# Patient Record
Sex: Male | Born: 1970 | Race: White | Hispanic: No | Marital: Single | Smoking: Current every day smoker
Health system: Southern US, Community
[De-identification: ages and names within clinical notes are randomized; demographics above are authoritative.]

## PROBLEM LIST (undated history)

## (undated) DIAGNOSIS — IMO0002 Reserved for concepts with insufficient information to code with codable children: Secondary | ICD-10-CM

## (undated) DIAGNOSIS — G459 Transient cerebral ischemic attack, unspecified: Secondary | ICD-10-CM

## (undated) HISTORY — PX: HERNIA REPAIR: SHX51

---

## 1999-06-21 ENCOUNTER — Emergency Department (HOSPITAL_COMMUNITY): Admission: EM | Admit: 1999-06-21 | Discharge: 1999-06-21 | Payer: Self-pay | Admitting: *Deleted

## 1999-08-30 ENCOUNTER — Emergency Department (HOSPITAL_COMMUNITY): Admission: EM | Admit: 1999-08-30 | Discharge: 1999-08-30 | Payer: Self-pay | Admitting: Emergency Medicine

## 2003-12-26 DIAGNOSIS — G459 Transient cerebral ischemic attack, unspecified: Secondary | ICD-10-CM

## 2003-12-26 HISTORY — DX: Transient cerebral ischemic attack, unspecified: G45.9

## 2004-09-12 ENCOUNTER — Emergency Department (HOSPITAL_COMMUNITY): Admission: EM | Admit: 2004-09-12 | Discharge: 2004-09-12 | Payer: Self-pay | Admitting: Family Medicine

## 2004-09-12 ENCOUNTER — Ambulatory Visit (HOSPITAL_COMMUNITY): Admission: RE | Admit: 2004-09-12 | Discharge: 2004-09-12 | Payer: Self-pay | Admitting: Family Medicine

## 2005-12-06 ENCOUNTER — Emergency Department (HOSPITAL_COMMUNITY): Admission: EM | Admit: 2005-12-06 | Discharge: 2005-12-06 | Payer: Self-pay | Admitting: Emergency Medicine

## 2005-12-27 ENCOUNTER — Ambulatory Visit (HOSPITAL_COMMUNITY): Admission: RE | Admit: 2005-12-27 | Discharge: 2005-12-27 | Payer: Self-pay | Admitting: Chiropractor
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
## 2009-02-03 ENCOUNTER — Emergency Department (HOSPITAL_COMMUNITY): Admission: EM | Admit: 2009-02-03 | Discharge: 2009-02-04 | Payer: Self-pay | Admitting: Emergency Medicine

## 2009-03-24 ENCOUNTER — Observation Stay (HOSPITAL_COMMUNITY): Admission: EM | Admit: 2009-03-24 | Discharge: 2009-03-25 | Payer: Self-pay | Admitting: Emergency Medicine

## 2009-11-25 ENCOUNTER — Emergency Department (HOSPITAL_COMMUNITY): Admission: EM | Admit: 2009-11-25 | Discharge: 2009-11-26 | Payer: Self-pay | Admitting: Emergency Medicine

## 2010-03-02 ENCOUNTER — Emergency Department (HOSPITAL_COMMUNITY): Admission: EM | Admit: 2010-03-02 | Discharge: 2010-03-02 | Payer: Self-pay | Admitting: Emergency Medicine

## 2010-07-11 ENCOUNTER — Emergency Department (HOSPITAL_COMMUNITY): Admission: EM | Admit: 2010-07-11 | Discharge: 2010-07-12 | Payer: Self-pay | Admitting: Emergency Medicine

## 2010-12-05 ENCOUNTER — Emergency Department (HOSPITAL_COMMUNITY)
Admission: EM | Admit: 2010-12-05 | Discharge: 2010-12-05 | Payer: Self-pay | Source: Home / Self Care | Admitting: Emergency Medicine

## 2011-03-11 LAB — DIFFERENTIAL
Basophils Absolute: 0 10*3/uL (ref 0.0–0.1)
Eosinophils Relative: 2 % (ref 0–5)
Lymphocytes Relative: 18 % (ref 12–46)
Monocytes Absolute: 0.7 10*3/uL (ref 0.1–1.0)
Monocytes Relative: 8 % (ref 3–12)

## 2011-03-11 LAB — POCT I-STAT, CHEM 8
BUN: 14 mg/dL (ref 6–23)
Creatinine, Ser: 0.9 mg/dL (ref 0.4–1.5)
Glucose, Bld: 94 mg/dL (ref 70–99)
Hemoglobin: 12.9 g/dL — ABNORMAL LOW (ref 13.0–17.0)
Potassium: 4.3 mEq/L (ref 3.5–5.1)

## 2011-03-11 LAB — URINALYSIS, ROUTINE W REFLEX MICROSCOPIC
Glucose, UA: NEGATIVE mg/dL
Specific Gravity, Urine: 1.026 (ref 1.005–1.030)

## 2011-03-11 LAB — CBC
HCT: 35.9 % — ABNORMAL LOW (ref 39.0–52.0)
Hemoglobin: 12.4 g/dL — ABNORMAL LOW (ref 13.0–17.0)
MCHC: 34.6 g/dL (ref 30.0–36.0)
MCV: 96.6 fL (ref 78.0–100.0)
RDW: 11.8 % (ref 11.5–15.5)
WBC: 7.8 10*3/uL (ref 4.0–10.5)

## 2011-03-11 LAB — URINE MICROSCOPIC-ADD ON

## 2011-03-11 LAB — URINE CULTURE

## 2011-04-05 LAB — GC/CHLAMYDIA PROBE AMP, URINE: Chlamydia, Swab/Urine, PCR: NEGATIVE

## 2011-04-06 LAB — CBC
HCT: 41.3 % (ref 39.0–52.0)
Hemoglobin: 14.5 g/dL (ref 13.0–17.0)
MCV: 95.4 fL (ref 78.0–100.0)
Platelets: 185 10*3/uL (ref 150–400)
RBC: 4.33 MIL/uL (ref 4.22–5.81)
WBC: 12.6 10*3/uL — ABNORMAL HIGH (ref 4.0–10.5)

## 2011-04-06 LAB — DIFFERENTIAL
Basophils Absolute: 0 10*3/uL (ref 0.0–0.1)
Basophils Relative: 0 % (ref 0–1)
Lymphocytes Relative: 9 % — ABNORMAL LOW (ref 12–46)
Monocytes Absolute: 0.8 10*3/uL (ref 0.1–1.0)
Neutro Abs: 10.4 10*3/uL — ABNORMAL HIGH (ref 1.7–7.7)

## 2011-04-06 LAB — COMPREHENSIVE METABOLIC PANEL
Alkaline Phosphatase: 74 U/L (ref 39–117)
BUN: 16 mg/dL (ref 6–23)
CO2: 27 mEq/L (ref 19–32)
Chloride: 105 mEq/L (ref 96–112)
Creatinine, Ser: 0.94 mg/dL (ref 0.4–1.5)
GFR calc non Af Amer: >60 mL/min (ref >60)
Glucose, Bld: 106 mg/dL — ABNORMAL HIGH (ref 70–99)
Potassium: 4.3 mEq/L (ref 3.5–5.1)
Total Bilirubin: 0.3 mg/dL (ref 0.3–1.2)

## 2011-04-06 LAB — URINALYSIS, ROUTINE W REFLEX MICROSCOPIC
Bilirubin Urine: NEGATIVE
Nitrite: NEGATIVE
Protein, ur: NEGATIVE mg/dL
Specific Gravity, Urine: 1.021 (ref 1.005–1.030)
Urobilinogen, UA: 1 mg/dL (ref 0.0–1.0)

## 2011-04-06 LAB — LIPASE, BLOOD: Lipase: 54 U/L (ref 11–59)

## 2011-04-06 LAB — URINE MICROSCOPIC-ADD ON

## 2011-04-11 LAB — CBC
MCV: 95.4 fL (ref 78.0–100.0)
Platelets: 215 10*3/uL (ref 150–400)
RBC: 4.55 MIL/uL (ref 4.22–5.81)
WBC: 8 10*3/uL (ref 4.0–10.5)

## 2011-04-11 LAB — COMPREHENSIVE METABOLIC PANEL
ALT: 39 U/L (ref 0–53)
AST: 26 U/L (ref 0–37)
Albumin: 3.8 g/dL (ref 3.5–5.2)
Alkaline Phosphatase: 71 U/L (ref 39–117)
CO2: 27 mEq/L (ref 19–32)
Chloride: 103 mEq/L (ref 96–112)
GFR calc Af Amer: >60 mL/min (ref >60)
GFR calc non Af Amer: >60 mL/min (ref >60)
Potassium: 3.8 mEq/L (ref 3.5–5.1)
Sodium: 137 mEq/L (ref 135–145)
Total Bilirubin: 0.7 mg/dL (ref 0.3–1.2)

## 2011-04-11 LAB — DIFFERENTIAL
Basophils Absolute: 0.1 10*3/uL (ref 0.0–0.1)
Eosinophils Absolute: 0.2 10*3/uL (ref 0.0–0.7)
Eosinophils Relative: 3 % (ref 0–5)
Lymphocytes Relative: 22 % (ref 12–46)
Monocytes Absolute: 0.5 10*3/uL (ref 0.1–1.0)

## 2011-04-11 LAB — URINALYSIS, ROUTINE W REFLEX MICROSCOPIC
Bilirubin Urine: NEGATIVE
Leukocytes, UA: NEGATIVE
Nitrite: NEGATIVE
Specific Gravity, Urine: 1.019 (ref 1.005–1.030)
Urobilinogen, UA: 0.2 mg/dL (ref 0.0–1.0)
pH: 6 (ref 5.0–8.0)

## 2011-04-11 LAB — LIPASE, BLOOD: Lipase: 49 U/L (ref 11–59)

## 2011-04-11 LAB — URINE MICROSCOPIC-ADD ON

## 2011-05-09 NOTE — H&P (Signed)
NAME:  Joe Mendez, Joe Mendez              ACCOUNT NO.:  [id number]   MEDICAL RECORD NO.:  [id number]          PATIENT TYPE:  OBV   LOCATION:  5012                         FACILITY:  [facility]   PHYSICIAN:  Della Goo, M.D. DATE OF BIRTH:  04-19-1971   DATE OF ADMISSION:  03/24/2009  DATE OF DISCHARGE:                              HISTORY & PHYSICAL   PRIMARY CARE PHYSICIAN:  Unassigned.   CHIEF COMPLAINT:  Right sided groin pain.   HISTORY OF PRESENT ILLNESS:  This is a 40 year old male who presented to  the emergency department secondary to complaints of severe worsening  pain in the right groin area.  He states that it has been worsening over  the past 2 weeks.  He reports that this condition has occurred off and  on for approximately 9 months.  He denies having any trauma to the area.  He denies having any dysuria or penile discharge.  He denies having any  fevers or chills.  He states that he had been seen in the hospital in  February 2010, for similar complaints and was evaluated.  However, he  reports that the workup was negative.  He states that he was placed on  pain medication to control his pain and he was released home and reports  that within 2 weeks the condition resolved, but returned again.  He  denies having any rash or night sweats.  He denies having any other  swollen glands that he has noticed.  The patient complains of having  right lower quadrant abdominal pain which has been crampy in nature as  well and was concerned that he had possible appendicitis.   PAST MEDICAL HISTORY:  None.   PAST SURGICAL HISTORY:  None.   MEDICATIONS:  None.   ALLERGIES:  PENICILLIN.   SOCIAL HISTORY:  The patient is a smoker and reports rare alcohol usage.   FAMILY HISTORY:  Positive for a very strong family history for cancers.   REVIEW OF SYSTEMS:  Pertinents are mentioned above.  All other organ  systems negative.   PHYSICAL EXAMINATION:  GENERAL:  This is a 40 year old  well-nourished,  well-developed male in discomfort, but no acute distress.  VITAL SIGNS:  Temperature 98.1, blood pressure 128/76, heart rate 77,  respirations 18.  O2 saturation 95-100%.  HEENT:  Normocephalic, atraumatic.  Pupils equally round and reactive to  light.  Extraocular movements are intact.  Funduscopic benign.  Oropharynx is clear.  NECK:  Supple.  Full range of motion.  No thyromegaly, adenopathy or  jugulovenous distention.  CARDIOVASCULAR:  Regular rate and rhythm.  No  murmurs, gallops or rubs.  LUNGS:  Clear to auscultation bilaterally.  ABDOMEN:  Positive bowel sounds.  Soft, nontender, nondistended.  EXTREMITIES:  Without cyanosis, clubbing or edema.  GENITOURINARY:  Examination of the genitourinary and inguinal areas  reveal unilateral adenopathy in the right inguinal area.  This area is  painful to palpation.  The lymph nodes in that area are enlarged and  firm.  Otherwise in the genitourinary area the patient has normal male  genitalia.  He is circumcised.  There  are no lesions seen or other  unusual masses.  There is no left-sided inguinal adenopathy present.  NEUROLOGIC:  The patient is alert and oriented x3.  There are no focal  deficits.   LABORATORY STUDIES:  White blood cell count 12.6, hemoglobin 14.5,  hematocrit 41.3, platelets 185, neutrophils 82%, lymphocytes 9%.  Sodium  139, potassium 4.3, chloride 105, carbon dioxide 27, BUN 16, creatinine  0.94, glucose 106, albumin 3.6, AST 19, ALT 23.  Urinalysis negative.  Urine microscopic; rare urine epithelials, 0-2 urine white blood cells,  3-6 urine red blood cells, rare bacteria.  Abdominal x-rays reveal a  normal bowel gas pattern with moderate fecal burden.   CT scan of the abdomen and pelvis reveal in the abdominal portion a  large hiatal hernia, but no acute abdominal abnormalities are seen.  A  low density lesion in the right inferior renal pole consistent with a  cyst is also seen and a CT of the  pelvis reveals no evidence of  appendicitis.  Enlargement of the prostate gland is also seen on the CT  of the pelvis.  Inflammatory changes in the right inguinal region with a  2 x 5 x 2.0 cm nodule with peripheral enhancement and central low  attenuation, possibly representing a phlegmon or small subcutaneous  abscess.  This area is suspicious for a small abscess or an infected  lymph node.  The left inguinal region was observed to be normal aside  from fatty infiltration of the inguinal canal.   ASSESSMENT:  A 40 year old male being admitted with;  1. Right lower quadrant abdominal and groin pain.  2. Right-sided inguinal adenopathy.  3. Abscess versus inflamed lymph node.  4. Leukocytosis with a left shift.   PLAN:  The patient will be admitted and placed on IV antibiotic therapy  of vancomycin.  IV fluids have been ordered and doxycycline has also  been ordered for oral administration.  Urine will be sent for gonorrhea  and chlamydia testing.  Pending these results, antibiotic therapy will  be further adjusted.  The patient will be placed on pain control therapy  and IV fluids have also been ordered for fluid resuscitation.  The  patient will be placed on DVT and GI prophylaxis as well and further  workup will ensue pending results of his studies and a biopsy of this  area will be considered.      Della Goo, M.D.  Electronically Signed     HJ/MEDQ  D:  03/24/2009  T:  03/24/2009  Job:  [id number]

## 2011-05-09 NOTE — Consult Note (Signed)
NAME:  LAWSEN, ARNOTT              ACCOUNT NO.:  [id number]   MEDICAL RECORD NO.:  [id number]          PATIENT TYPE:  OBV   LOCATION:  5012                         FACILITY:  [facility]   PHYSICIAN:  Lennie Muckle, MD      DATE OF BIRTH:  11/09/1971   DATE OF CONSULTATION:  03/24/2009  DATE OF DISCHARGE:                                 CONSULTATION   REQUESTING PHYSICIAN:  Eduard Clos, MD   REASON FOR CONSULTATION:  Lymphadenopathy versus right groin abscess.   HISTORY OF PRESENT ILLNESS:  Mr. Cecena is a 40 year old white male with  no significant past medical history but a rather complicated and unclear  history of this event.  Part of the history is provided by his sigficant  other who was present in the room as well as the patient. He states that  he has never had anything like this before that they need to concern but  then contradicts himself by stating this started in 2005.  Upon further  questioning, he states he is not able to give me a clear history and I  am not sure why; however, he says that his significant other is the only  one who knows his story.  Per his significant other, he has had pain for  the past 3-4 days.  He has never had any knot or lumps in his groin  before.  She states that he does have pain in this area.  He is not  having any urinary symptoms, penile discharge, or any other bowel  problems such as constipation or diarrhea.  Due to this pain, the  patient came to the emergency room where CT of the abdomen and pelvis  was done, which showed a 2.5 x 2.0 cm nodule with inflammatory changes  of the right groin, which is questionable for either a phlegmon or small  subcutaneous abscess.  He does report  having some involuntary leg  jerking, mostly in this right leg.  WBC is mildly elevated at 12,600.  We are consulted to provide opinion on whether this was a lymph node  versus an abscess.   REVIEW OF SYSTEMS:  Please see HPI, otherwise all other systems  are  negative.   PAST MEDICAL HISTORY:  None.   PAST SURGICAL HISTORY:  None.   SOCIAL HISTORY:  The patient has a significant other; however, he will  not definitively state whether this is his wife, girlfriend, fiancee,  etc.  His brother and his father are also present in this room.  He  works for [employer].  He does smoke approximately half a packet of  cigarettes a day, and he admits to rare alcohol use.   ALLERGIES:  PENICILLIN.   MEDICATIONS AT HOME:  None.   PHYSICAL EXAMINATION:  GENERAL:  This is a 40 year old white male who is  currently lying in bed in no acute distress.  VITAL SIGNS:  Temperature 98.3, pulse 78, respirations 18, and blood  pressure 115/66.  HEENT:  Head is normocephalic and atraumatic.  Sclerae noninjected.  Pupils were equal, round, and reactive to light.  Ears and nose without  any obvious masses or lesions.  No rhinorrhea.  Mouth is pink and moist.  Throat shows no exudate.  HEART:  Regular rate and rhythm.  Normal S1 and S2.  No murmurs,  gallops, or rubs are noted.  A +2 carotid, radial, and pedal pulses  bilaterally.  LUNGS:  Clear to auscultation bilaterally with no wheezes, rhonchi, or  rales noted.  Respiratory effort is nonlabored.  ABDOMEN:  Soft, nontender, and nondistended with active bowel sounds.  GENITOURINARY:  The patient has a right inguinal nodule that is very  tender to palpation, and it feels somewhat hard and not fluctuant.  He  does have some edema on the right side secondary to this nodule.  Otherwise, no other lymphadenopathy is noted on the right side and  shoddy lymph nodes are palapable on the left.  In this area, he does not  have any erythema or warmth.  Otherwise, his male genitalia is normal.  The patient does have a left inguinal hernia that is completely  reducible and pain free.  MUSCULOSKELETAL:  All 4 extremities are symmetrical with no cyanosis,  clubbing, or edema.  NEUROLOGIC:  Cranial nerves II through XII  appear to be grossly intact.  PSYCH:  The patient is alert and oriented x3.   LABORATORY DATA AND DIAGNOSTICS:  White blood cell count is 4600,  hemoglobin 14.5, hematocrit 41.3, platelets are 185,000, and neutrophils  are 82%.  Sodium 139, potassium 4.3, glucose 106, BUN 16, and creatinine  0.94.  Lipase is 54.  CT of the abdomen and pelvis per HPI.   IMPRESSION:  1. Right inguinal lymphadenopathy with an enlarged 2.5 x 2.0 cm lymph      node.  2. Asymptomatic reducible left inguinal hernia.  3. Minimal leukocytosis.   PLAN:  Upon reviewing the CT and the patient's history, more than likely  this is an enlarged lymph node.  Therefore, we would recommend  discontinuing the patient on vancomycin and just continuing him on  doxycycline.  We feel that the patient's lymph node will get better with  conservative treatment, and there is no surgical interrogation at this  time for removal of lymph node or biopsy.  As far as the patient's left  inguinal hernia, when disinfection and lymphadenopathy are completely  resolved, he may return to our office and see Dr. Freida Busman or any of our  physicians for an elective hernia repair.      Letha Cape, PA      Lennie Muckle, MD  Electronically Signed    KEO/MEDQ  D:  03/24/2009  T:  03/25/2009  Job:  [id number]   cc:   Eduard Clos, MD

## 2011-05-09 NOTE — Discharge Summary (Signed)
NAME:  Joe Mendez, Joe Mendez              ACCOUNT NO.:  [id number]   MEDICAL RECORD NO.:  [id number]          PATIENT TYPE:  OBV   LOCATION:  5012                         FACILITY:  [facility]   PHYSICIAN:  Eduard Clos, MDDATE OF BIRTH:  21-May-1971   DATE OF ADMISSION:  03/23/2009  DATE OF DISCHARGE:  03/25/2009                               DISCHARGE SUMMARY   COURSE IN THE HOSPITAL:  A 40 year old male with no significant past  medical history, presented with complaints of right groin pain.  The  patient had no associated dysuria, penile discharge, fever, or chills on  admission.  The patient had a CAT scan of abdomen and pelvis which  showed right inguinal region with 2.5 x 2 cm nodule with peripheral  enhancement and central low-attenuation.  The patient admitted to  medical floor and started on empiric antibiotics.  A surgical consult  was obtained.  As per Surgery, this was reactive lymphadenitis and there  was no abscess to be drained.  The patient did have right inguinal  hernia, which was reducible and per Surgery, no surgical intervention at  this time.  Once reactive lymphadenitis was treated with doxycycline, to  follow up with Surgery for further workup of his inguinal hernia at this  time.  The patient would be discharged home on doxycycline for 10 days  and followup with [facility] on Apr 27, 2009, and if the lymph nodes are  still persistently tender and enlarged then further workup which may  include a biopsy.  At the time of this dictation, the patient was  hemodynamically stable, and the patient's  pain has largely improved.   PROCEDURES DONE DURING THE STAY:  CT of the abdomen and pelvis.  Large  hiatal hernia.  No acute abdominal abnormality.  Low density lesion of  the right inferior renal pole  likely representing a cyst.  No evidence  of appendicitis or inflammatory changes in the right inguinal region  with 2.5 x 2 cm nodule with peripheral enhancement and central  low-  attenuation.  This probably represent either a phlegmon or a small  subcutaneous abscess.   FINAL DIAGNOSES:  1. Right inguinal region, reactive lymphadenitis.  2. Left inguinal hernia, reducible.   MEDICATIONS AT DISCHARGE:  1. Doxycycline 100 mg p.o. b.i.d. for 10 days.  2. The patient is to receive erythromycin 2 g p.o. 1 pill before      discharge.  3. Ibuprofen 800 mg p.o. t.i.d. p.r.n. with meals for pain.  If the      ibuprofen does not relieve the pain, then oxycodone acetaminophen      5/325 mg p.o. q.6 p.r.n. for pain.   PLAN:  The patient advised to follow up with [facility] in a month's  time on Apr 27, 2009, as scheduled.  The patient is strongly advised to  fill this appointment to further assess his lymphadenopathy.  To follow  up with [facility].  To call to make an appointment, [phone] for further workup on his left inguinal hernia.  The patient  advised to keep his appointments and to  be on a regular diet.     Eduard Clos, MD  Electronically Signed    ANK/MEDQ  D:  03/25/2009  T:  03/26/2009  Job:  [id number]

## 2011-09-23 ENCOUNTER — Emergency Department (HOSPITAL_COMMUNITY)
Admission: EM | Admit: 2011-09-23 | Discharge: 2011-09-23 | Disposition: A | Discharge status: Home / Self Care | Payer: Self-pay | Attending: Emergency Medicine | Admitting: Emergency Medicine

## 2011-09-23 DIAGNOSIS — K612 Anorectal abscess: Secondary | ICD-10-CM | POA: Insufficient documentation

## 2011-09-23 DIAGNOSIS — K6289 Other specified diseases of anus and rectum: Secondary | ICD-10-CM | POA: Insufficient documentation

## 2011-11-06 ENCOUNTER — Emergency Department (INDEPENDENT_AMBULATORY_CARE_PROVIDER_SITE_OTHER)
Admission: EM | Admit: 2011-11-06 | Discharge: 2011-11-06 | Disposition: A | Discharge status: Home / Self Care | Payer: Self-pay | Source: Home / Self Care | Attending: Family Medicine | Admitting: Family Medicine

## 2011-11-06 ENCOUNTER — Encounter (HOSPITAL_COMMUNITY): Payer: Self-pay | Admitting: Family Medicine

## 2011-11-06 DIAGNOSIS — H01009 Unspecified blepharitis unspecified eye, unspecified eyelid: Secondary | ICD-10-CM

## 2011-11-06 MED ORDER — GENTAMICIN SULFATE 0.3 % OP OINT: TOPICAL_OINTMENT | Freq: Three times a day (TID) | OPHTHALMIC | Status: AC

## 2011-11-06 NOTE — ED Notes (Signed)
Pt here with left eye redness and irritation that started Saturday.swelling seen above upper lid with sore to the touch.denies blurred vision today or dizziness.pt tried warm compresses

## 2011-11-06 NOTE — ED Provider Notes (Signed)
History     CSN: [id number] Arrival date & time: 11/06/2011  1:05 PM   First MD Initiated Contact with Patient 11/06/11 1255      Chief Complaint  Patient presents with  . Conjunctivitis    (Consider location/radiation/quality/duration/timing/severity/associated sxs/prior treatment) Patient is a 40 y.o. male presenting with conjunctivitis. The history is provided by the patient.  Conjunctivitis  The current episode started 2 days ago. The problem has been gradually improving. The problem is mild. Pertinent negatives include no fever, no decreased vision, no double vision, no eye itching, no photophobia, no eye discharge and no eye redness. The eye pain is mild.  He thinks some of the redness due to application of warm compress. Denies discharge or visual disturbance.   History reviewed. No pertinent past medical history.  No past surgical history on file.  No family history on file.  History  Substance Use Topics  . Smoking status: Not on file  . Smokeless tobacco: Not on file  . Alcohol Use: Not on file      Review of Systems  Constitutional: Negative.  Negative for fever.  HENT: Negative.   Eyes: Negative for double vision, photophobia, discharge, redness and itching.  Respiratory: Negative.   Cardiovascular: Negative.   Gastrointestinal: Negative.     Allergies  Penicillins  Home Medications   Current Outpatient Rx  Name Route Sig Dispense Refill  . GENTAMICIN SULFATE 0.3 % OP OINT Left Eye Place into the left eye 3 (three) times daily. 3.5 g 0    BP 127/92  Pulse 63  Temp(Src) 98.3 F (36.8 C) (Oral)  Resp 16  SpO2 99%  Physical Exam  Constitutional: He appears well-developed and well-nourished.  HENT:  Head: Normocephalic and atraumatic.  Eyes: Conjunctivae and EOM are normal. Pupils are equal, round, and reactive to light.       Swelling of the left upper eyelid. Small focus of erythema nasal border of the lid. No pustule.   Cardiovascular:  Normal rate.   Pulmonary/Chest: Effort normal.  Skin: Skin is warm and dry.    ED Course  Procedures (including critical care time)  Labs Reviewed - No data to display No results found.   1. Blepharitis       MDM          Randa Spike, MD 11/06/11 1407

## 2012-06-11 ENCOUNTER — Encounter (HOSPITAL_COMMUNITY): Payer: Self-pay | Admitting: Emergency Medicine

## 2012-06-11 DIAGNOSIS — R6884 Jaw pain: Secondary | ICD-10-CM | POA: Insufficient documentation

## 2012-06-11 DIAGNOSIS — S0003XA Contusion of scalp, initial encounter: Secondary | ICD-10-CM | POA: Insufficient documentation
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
## 2012-06-12 ENCOUNTER — Emergency Department (HOSPITAL_COMMUNITY): Payer: Self-pay

## 2012-06-12 ENCOUNTER — Emergency Department (HOSPITAL_COMMUNITY)
Admission: EM | Admit: 2012-06-12 | Discharge: 2012-06-12 | Disposition: A | Discharge status: Home / Self Care | Payer: Self-pay | Attending: Emergency Medicine | Admitting: Emergency Medicine

## 2012-06-12 DIAGNOSIS — S0083XA Contusion of other part of head, initial encounter: Secondary | ICD-10-CM

## 2012-06-12 MED ORDER — OXYCODONE-ACETAMINOPHEN 5-325 MG PO TABS
1.0000 | ORAL_TABLET | Freq: Once | ORAL | Status: AC
  Administered 2012-06-12: 1 via ORAL
  Filled 2012-06-12: qty 1

## 2012-06-12 NOTE — ED Notes (Signed)
Remains resting in bed on back. Denies needs. No distress. Call bell within reach. Pain 6\10. Family with patient.
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
## 2012-06-12 NOTE — ED Notes (Signed)
Pain 6/10 at this time. Resting comfortably. Call bell within reach. Denies needs. Family with patient.

## 2012-06-12 NOTE — ED Notes (Signed)
Medicated for 9/10 pain. Denies needs. Call bell within reach. No distress. Will continue to monitor.
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
## 2012-06-12 NOTE — ED Notes (Signed)
Resting with eyes open and lights on. Pain 9\10 at this time. Call bell within reach. Will continue to monitor. Awaiting MD eval.

## 2012-06-12 NOTE — ED Notes (Signed)
Patient back to room from radiology. Pain 5\10 at this time. No distress. Denies needs. Call bell within reach. Wife with patient.

## 2012-06-12 NOTE — ED Notes (Signed)
Remains resting in bed on back. No distress. Call bell within reach. Pain 9\10. Family with patient. Will continue to monitor.

## 2012-06-12 NOTE — ED Notes (Signed)
MD at bedside to evaluate.

## 2012-06-12 NOTE — ED Notes (Signed)
Patient to radiology via wheelchair.

## 2012-06-12 NOTE — ED Notes (Signed)
Remains resting on left side. No distress. Pain 6\10. Call bell within reach. Denies needs. Awaiting radiology.

## 2012-06-12 NOTE — Discharge Instructions (Signed)
Take, call or ibuprofen as needed for pain.  Contusion A contusion is a deep bruise. Contusions are the result of an injury that caused bleeding under the skin. The contusion may turn blue, purple, or yellow. Minor injuries will give you a painless contusion, but more severe contusions may stay painful and swollen for a few weeks.  CAUSES  A contusion is usually caused by a blow, trauma, or direct force to an area of the body. SYMPTOMS   Swelling and redness of the injured area.   Bruising of the injured area.   Tenderness and soreness of the injured area.   Pain.  DIAGNOSIS  The diagnosis can be made by taking a history and physical exam. An X-ray, CT scan, or MRI may be needed to determine if there were any associated injuries, such as fractures. TREATMENT  Specific treatment will depend on what area of the body was injured. In general, the best treatment for a contusion is resting, icing, elevating, and applying cold compresses to the injured area. Over-the-counter medicines may also be recommended for pain control. Ask your caregiver what the best treatment is for your contusion. HOME CARE INSTRUCTIONS   Put ice on the injured area.   Put ice in a plastic bag.   Place a towel between your skin and the bag.   Leave the ice on for 15 to 20 minutes, 3 to 4 times a day.   Only take over-the-counter or prescription medicines for pain, discomfort, or fever as directed by your caregiver. Your caregiver may recommend avoiding anti-inflammatory medicines (aspirin, ibuprofen, and naproxen) for 48 hours because these medicines may increase bruising.   Rest the injured area.   If possible, elevate the injured area to reduce swelling.  SEEK IMMEDIATE MEDICAL CARE IF:   You have increased bruising or swelling.   You have pain that is getting worse.   Your swelling or pain is not relieved with medicines.  MAKE SURE YOU:   Understand these instructions.   Will watch your condition.     Will get help right away if you are not doing well or get worse.  Document Released: 09/20/2005 Document Revised: 11/30/2011 Document Reviewed: 10/16/2011 [facility] Patient Information 2012 [place], [place].

## 2012-10-22 ENCOUNTER — Encounter (HOSPITAL_COMMUNITY): Payer: Self-pay | Admitting: *Deleted

## 2012-10-22 ENCOUNTER — Emergency Department (HOSPITAL_COMMUNITY)
Admission: EM | Admit: 2012-10-22 | Discharge: 2012-10-22 | Disposition: A | Discharge status: Home / Self Care | Payer: Self-pay | Attending: Emergency Medicine | Admitting: Emergency Medicine

## 2012-10-22 ENCOUNTER — Emergency Department (HOSPITAL_COMMUNITY): Payer: Self-pay

## 2012-10-22 DIAGNOSIS — F17200 Nicotine dependence, unspecified, uncomplicated: Secondary | ICD-10-CM | POA: Insufficient documentation

## 2012-10-22 DIAGNOSIS — IMO0002 Reserved for concepts with insufficient information to code with codable children: Secondary | ICD-10-CM | POA: Insufficient documentation

## 2012-10-22 DIAGNOSIS — Y939 Activity, unspecified: Secondary | ICD-10-CM | POA: Insufficient documentation

## 2012-10-22 DIAGNOSIS — Y929 Unspecified place or not applicable: Secondary | ICD-10-CM | POA: Insufficient documentation

## 2012-10-22 DIAGNOSIS — W11XXXA Fall on and from ladder, initial encounter: Secondary | ICD-10-CM | POA: Insufficient documentation

## 2012-10-22 DIAGNOSIS — S6980XA Other specified injuries of unspecified wrist, hand and finger(s), initial encounter: Secondary | ICD-10-CM | POA: Insufficient documentation

## 2012-10-22 DIAGNOSIS — S01409A Unspecified open wound of unspecified cheek and temporomandibular area, initial encounter: Secondary | ICD-10-CM | POA: Insufficient documentation

## 2012-10-22 DIAGNOSIS — S6990XA Unspecified injury of unspecified wrist, hand and finger(s), initial encounter: Secondary | ICD-10-CM | POA: Insufficient documentation

## 2012-10-22 MED ORDER — HYDROCODONE-ACETAMINOPHEN 5-325 MG PO TABS: 2.0000 | ORAL_TABLET | ORAL | Status: DC | PRN

## 2012-10-22 NOTE — ED Notes (Signed)
Patient transported to X-ray 

## 2012-10-22 NOTE — ED Notes (Signed)
Patient transported from X-ray 

## 2012-10-22 NOTE — ED Notes (Signed)
Pt fell off ladder yesterday when ladder slide  And now with right hand and concerned about ring find.  Pt has laceration to right cheek.  Pt denies LOC.  Pt states ladder was 7-8 feet high

## 2012-10-22 NOTE — ED Provider Notes (Signed)
History  Scribed for Nelia Shi, MD, the patient was seen in room TR07C/TR07C. This chart was scribed by Candelaria Stagers. The patient's care started at 6:52 PM   CSN: [id number]  Arrival date & time 10/22/12  4098   First MD Initiated Contact with Patient 10/22/12 1851      No chief complaint on file.    The history is provided by the patient. No language interpreter was used.   Joe Mendez is a 41 y.o. male who presents to the Emergency Department complaining of right ring finger pain after falling off a ladder yesterday injuring his finger.  He is also experiencing swelling of the right ring finger.  Pt denies LOC.  He states the fall was about 7-8 ft high.  He has no other injuries.   History reviewed. No pertinent past medical history.  Past Surgical History  Procedure Date  . Hernia repair     No family history on file.  History  Substance Use Topics  . Smoking status: Current Every Day Smoker -- 1.0 packs/day  . Smokeless tobacco: Not on file  . Alcohol Use: No      Review of Systems All other systems reviewed and are negative Allergies  Penicillins  Home Medications   Current Outpatient Rx  Name Route Sig Dispense Refill  . ACETAMINOPHEN 325 MG PO TABS Oral Take 650 mg by mouth daily as needed. For pain    . NEOSPORIN EX Apply externally Apply 1 application topically 2 (two) times daily as needed. For infection    . HYDROCODONE-ACETAMINOPHEN 5-325 MG PO TABS Oral Take 2 tablets by mouth every 4 (four) hours as needed for pain. 10 tablet 0    BP 127/71  Pulse 83  Temp 97.5 F (36.4 C) (Oral)  Resp 18  SpO2 99%  Physical Exam  Nursing note and vitals reviewed. Constitutional: He is oriented to person, place, and time. He appears well-developed and well-nourished. No distress.  HENT:  Head: Normocephalic and atraumatic.  Eyes: Pupils are equal, round, and reactive to light.  Neck: Normal range of motion.  Cardiovascular: Normal rate and  intact distal pulses.   Pulmonary/Chest: No respiratory distress.  Abdominal: Normal appearance. He exhibits no distension.  Musculoskeletal: Normal range of motion.       Right hand: He exhibits no deformity.       Hands: Neurological: He is alert and oriented to person, place, and time. No cranial nerve deficit.  Skin: Skin is warm and dry. No rash noted.  Psychiatric: He has a normal mood and affect. His behavior is normal.    ED Course  Procedures   DIAGNOSTIC STUDIES:  COORDINATION OF CARE:  18:54 Ordered: DG Finger Ring Right    Labs Reviewed - No data to display Dg Finger Ring Right  10/22/2012  *RADIOLOGY REPORT*  Clinical Data: Fall from ladder.  Left ring finger pain.  RIGHT RING FINGER 2+V  Comparison: None.  Findings: No acute bone or soft tissue abnormality is present.  IMPRESSION: Negative right ring finger.   Original Report Authenticated By: Jamesetta Orleans. MATTERN, M.D.      1. Finger injury       MDM  I personally performed the services described in this documentation, which was scribed in my presence. The recorded information has been reviewed and considered. I personally performed the services described in this documentation, which was scribed in my presence. The recorded information has been reviewed and considered.  Nelia Shi, MD 10/22/12 [phone]

## 2012-10-22 NOTE — ED Notes (Signed)
Multiple small abrasions noted to bilateral hands.  Swelling and decreased ROM noted to fourth digit of right hand.  Patient endorses pain to right hand.  Per patient report, patient fell from ladder yesterday striking his hands on the ground when falling.  Patient denies any other complaints at this time.

## 2013-07-21 ENCOUNTER — Emergency Department (HOSPITAL_COMMUNITY): Payer: Self-pay

## 2013-07-21 ENCOUNTER — Encounter (HOSPITAL_COMMUNITY): Payer: Self-pay | Admitting: *Deleted

## 2013-07-21 DIAGNOSIS — Z880 Allergy status to penicillin: Secondary | ICD-10-CM | POA: Insufficient documentation

## 2013-07-21 DIAGNOSIS — F17200 Nicotine dependence, unspecified, uncomplicated: Secondary | ICD-10-CM | POA: Insufficient documentation

## 2013-07-21 DIAGNOSIS — G8929 Other chronic pain: Secondary | ICD-10-CM | POA: Insufficient documentation

## 2013-07-21 DIAGNOSIS — M545 Low back pain, unspecified: Secondary | ICD-10-CM | POA: Insufficient documentation

## 2013-07-21 DIAGNOSIS — R0602 Shortness of breath: Secondary | ICD-10-CM | POA: Insufficient documentation

## 2013-07-21 DIAGNOSIS — R079 Chest pain, unspecified: Secondary | ICD-10-CM | POA: Insufficient documentation

## 2013-07-21 DIAGNOSIS — Z8673 Personal history of transient ischemic attack (TIA), and cerebral infarction without residual deficits: Secondary | ICD-10-CM | POA: Insufficient documentation

## 2013-07-21 DIAGNOSIS — R350 Frequency of micturition: Secondary | ICD-10-CM | POA: Insufficient documentation

## 2013-07-21 DIAGNOSIS — M6281 Muscle weakness (generalized): Secondary | ICD-10-CM | POA: Insufficient documentation

## 2013-07-21 DIAGNOSIS — R42 Dizziness and giddiness: Secondary | ICD-10-CM | POA: Insufficient documentation

## 2013-07-21 DIAGNOSIS — IMO0001 Reserved for inherently not codable concepts without codable children: Secondary | ICD-10-CM | POA: Insufficient documentation

## 2013-07-21 NOTE — ED Notes (Addendum)
Weakness dizziness blurry vision onset last night. Back pain, sob, CP onset this am. Back pain CP and sob all day today. No sx have resolved, all sx remain. Pt concerned for stroke. Alert, NAD, calm, interactive, resps e/u, speaking in clear complete sentences, skin W&D. Chest, Neck and back hurt worse with movement. PERRL, 3mm brisk, A&Ox4, MAEx4, no droop or drift, grip strength and coordination are equal and strong. Speech clear. LS CTA. Sx vague. "Pt is a delivery driver. Did not sleep well last night".

## 2013-07-22 ENCOUNTER — Emergency Department (HOSPITAL_COMMUNITY)
Admission: EM | Admit: 2013-07-22 | Discharge: 2013-07-22 | Disposition: A | Discharge status: Home / Self Care | Payer: Self-pay | Attending: Emergency Medicine | Admitting: Emergency Medicine

## 2013-07-22 ENCOUNTER — Emergency Department (HOSPITAL_COMMUNITY): Payer: Self-pay

## 2013-07-22 ENCOUNTER — Encounter (HOSPITAL_COMMUNITY): Payer: Self-pay

## 2013-07-22 DIAGNOSIS — G8929 Other chronic pain: Secondary | ICD-10-CM

## 2013-07-22 DIAGNOSIS — R5383 Other fatigue: Secondary | ICD-10-CM

## 2013-07-22 DIAGNOSIS — M549 Dorsalgia, unspecified: Secondary | ICD-10-CM

## 2013-07-22 DIAGNOSIS — R42 Dizziness and giddiness: Secondary | ICD-10-CM

## 2013-07-22 DIAGNOSIS — M791 Myalgia, unspecified site: Secondary | ICD-10-CM

## 2013-07-22 DIAGNOSIS — R5381 Other malaise: Secondary | ICD-10-CM

## 2013-07-22 HISTORY — DX: Transient cerebral ischemic attack, unspecified: G45.9

## 2013-07-22 LAB — CBC WITH DIFFERENTIAL/PLATELET
Basophils Absolute: 0 10*3/uL (ref 0.0–0.1)
Basophils Relative: 1 % (ref 0–1)
Eosinophils Absolute: 0.3 10*3/uL (ref 0.0–0.7)
Hemoglobin: 14.4 g/dL (ref 13.0–17.0)
MCH: 33.5 pg (ref 26.0–34.0)
MCHC: 36.1 g/dL — ABNORMAL HIGH (ref 30.0–36.0)
Monocytes Absolute: 0.5 10*3/uL (ref 0.1–1.0)
Monocytes Relative: 6 % (ref 3–12)
Neutro Abs: 6 10*3/uL (ref 1.7–7.7)
Neutrophils Relative %: 70 % (ref 43–77)
RDW: 12.4 % (ref 11.5–15.5)

## 2013-07-22 LAB — POCT I-STAT, CHEM 8
Calcium, Ion: 1.17 mmol/L (ref 1.12–1.23)
Glucose, Bld: 135 mg/dL — ABNORMAL HIGH (ref 70–99)
HCT: 42 % (ref 39.0–52.0)
Hemoglobin: 14.3 g/dL (ref 13.0–17.0)
Potassium: 3.1 mEq/L — ABNORMAL LOW (ref 3.5–5.1)

## 2013-07-22 LAB — URINALYSIS, ROUTINE W REFLEX MICROSCOPIC
Glucose, UA: NEGATIVE mg/dL
Hgb urine dipstick: NEGATIVE
Ketones, ur: NEGATIVE mg/dL
Protein, ur: NEGATIVE mg/dL
Urobilinogen, UA: 0.2 mg/dL (ref 0.0–1.0)

## 2013-07-22 LAB — CK TOTAL AND CKMB (NOT AT ARMC): Total CK: 86 U/L (ref 7–232)

## 2013-07-22 LAB — POCT I-STAT TROPONIN I: Troponin i, poc: 0.02 ng/mL (ref 0.00–0.08)

## 2013-07-22 MED ORDER — ONDANSETRON HCL 4 MG/2ML IJ SOLN
4.0000 mg | Freq: Once | INTRAMUSCULAR | Status: AC
  Administered 2013-07-22: 4 mg via INTRAVENOUS
  Filled 2013-07-22: qty 2

## 2013-07-22 MED ORDER — MORPHINE SULFATE 4 MG/ML IJ SOLN: 4.0000 mg | Freq: Once | INTRAMUSCULAR | Status: DC

## 2013-07-22 MED ORDER — TRAMADOL HCL 50 MG PO TABS: 50.0000 mg | ORAL_TABLET | Freq: Four times a day (QID) | ORAL | Status: AC | PRN

## 2013-07-22 MED ORDER — SODIUM CHLORIDE 0.9 % IV BOLUS (SEPSIS)
1000.0000 mL | Freq: Once | INTRAVENOUS | Status: AC
  Administered 2013-07-22: 1000 mL via INTRAVENOUS

## 2013-07-22 MED ORDER — MORPHINE SULFATE 4 MG/ML IJ SOLN
4.0000 mg | Freq: Once | INTRAMUSCULAR | Status: AC
  Administered 2013-07-22: 4 mg via INTRAVENOUS
  Filled 2013-07-22: qty 1

## 2013-07-22 MED ORDER — IOHEXOL 350 MG/ML SOLN
100.0000 mL | Freq: Once | INTRAVENOUS | Status: AC | PRN
  Administered 2013-07-22: 100 mL via INTRAVENOUS

## 2013-07-22 NOTE — Consult Note (Addendum)
[facility] Medical Consultation  Guiseppe Flanagan WUJ:[id number] DOB: [birth year] DOA: 07/22/2013 PCP: Default, Provider, MD   Requesting physician:EDP Date of consultation: 07/22/13 Reason for consultation: multiple symptoms  Impression/Recommendations 1. Multiple Symptoms -R sided chest pain, dizziness, weakness, back pain, headache and intermittent Shortness of breath -history very vague and constantly changing and workup in ER normal. -Vitals and labs normal -cardiac enzymes negative x2 -Ekg benign -chest pain: reproducible by palpating R lower rib cage -CT head normal, exam non focal -Xray L spine with old L1 compression fracture -could get CTA Chest and DC home if this is normal. -I have told pt and neighbor about DC home if Ct normal. -needs PCP  Zannie Cove, MD [phone]    Chief Complaint: multiple complaints  HPI: 41/M with alleged h/o TIA, presented to ER last pm with mulitple complaints, notable for back pain, R sided chest pain, weakness, dizziness yesterday morning. He is in the ER with some neighbors, pt frequently turns to his neighbors to ask them about his symptoms. His history changed multiple times during my interview. In addition he also has some SoB from time to time and head ache.    Review of Systems:  12 system review negative per HPI  Past Medical History  Diagnosis Date  . TIA (transient ischemic attack) 2005   Past Surgical History  Procedure Laterality Date  . Hernia repair     Social History:  reports that he has been smoking.  He does not have any smokeless tobacco history on file. He reports that he does not drink alcohol or use illicit drugs.  Allergies  Allergen Reactions  . Penicillins Itching and Nausea And Vomiting   Family History  Problem Relation Age of Onset  . Hypertension Mother     Prior to Admission medications   Medication Sig Start Date End Date Taking? Authorizing Provider  traMADol (ULTRAM) 50 MG tablet  Take 1 tablet (50 mg total) by mouth every 6 (six) hours as needed for pain. 07/22/13   Arman Filter, NP   Physical Exam: Blood pressure 113/72, pulse 76, temperature 98.4 F (36.9 C), temperature source Oral, resp. rate 26, SpO2 97.00%. Filed Vitals:   07/21/13 2325 07/22/13 0245 07/22/13 0300  BP: 134/80 115/68 113/72  Pulse: 78 82 76  Temp: 98.4 F (36.9 C)    TempSrc: Oral    Resp: 18 16 26   SpO2: 98% 98% 97%     General:  AAOx3 no distress  HEENTL: PERRLA, EOMI  Cardiovascular: S1S2/RRR  Resp: CTAB  Chest wall: tenderness in RLQ chest wall/rib cage to palpation  Abdomen: soft, Nt, BS present  Skin: no rahses  Musculoskeletal: no edema c/c  Psychiatric: appropriate mood and affect  Neurologic: no localising signs  Labs on Admission:  Basic Metabolic Panel:  Recent Labs Lab 07/22/13 0001  NA 140  K 3.1*  CL 102  GLUCOSE 135*  BUN 9  CREATININE 0.90   Liver Function Tests: No results found for this basename: AST, ALT, ALKPHOS, BILITOT, PROT, ALBUMIN,  in the last 168 hours No results found for this basename: LIPASE, AMYLASE,  in the last 168 hours No results found for this basename: AMMONIA,  in the last 168 hours CBC:  Recent Labs Lab 07/21/13 2345 07/22/13 0001  WBC 8.6  --   NEUTROABS 6.0  --   HGB 14.4 14.3  HCT 39.9 42.0  MCV 92.8  --   PLT 183  --    Cardiac Enzymes:  Recent  Labs Lab 07/22/13 0328  CKTOTAL 86  CKMB 1.5  TROPONINI <0.30   BNP: No components found with this basename: POCBNP,  CBG: No results found for this basename: GLUCAP,  in the last 168 hours  Radiological Exams on Admission: Dg Chest 2 View  07/21/2013   *RADIOLOGY REPORT*  Clinical Data: Back pain, weakness, shortness of breath and dizziness.  CHEST - 2 VIEW  Comparison: None.  Findings: Mild hyperinflation and interstitial prominence is suggestive of chronic lung disease.  No edema, infiltrate, nodule or pleural effusion is identified.  The heart size and  mediastinal contours are within normal limits.  Bony thorax is unremarkable.  IMPRESSION: No acute findings.  Probable component of chronic lung disease.   Original Report Authenticated By: Irish Lack, M.D.   Dg Lumbar Spine Complete  07/22/2013   *RADIOLOGY REPORT*  Clinical Data: Back pain.  Weakness.  No injury.  LUMBAR SPINE - COMPLETE 4+ VIEW  Comparison: 03/23/2009.  Findings: There are five lumbar type vertebral bodies.  Vertebral body height is preserved.  There is a chronic L1 compression fracture without 25% loss of anterior vertebral body height and minimal retropulsion.  This appears unchanged compared to CT 03/24/2009.  There is no acute osseous abnormality.  The intervertebral disc spaces are preserved.  No pars defects are present.  IMPRESSION: No acute osseous abnormality.  Chronic L1 compression fracture.   Original Report Authenticated By: Andreas Newport, M.D.   Ct Head Wo Contrast  07/22/2013   *RADIOLOGY REPORT*  Clinical Data: Weakness.  Blurry vision.  CT HEAD WITHOUT CONTRAST  Technique:  Contiguous axial images were obtained from the base of the skull through the vertex without contrast.  Comparison: 12/05/2010.  Findings: No mass lesion, mass effect, midline shift, hydrocephalus, hemorrhage.  No territorial ischemia or acute infarction. Paranasal sinuses appear within normal limits.  Left nasal septal spur.  Mastoid air cells clear.  Calvarium intact.  IMPRESSION: Negative CT head.   Original Report Authenticated By: Andreas Newport, M.D.    EKG: Independently reviewed. No acute St t wave changes  Time spent:  Keyera Hattabaugh [facility] Pager [phone]  If 7PM-7AM, please contact night-coverage [email or web address] Password [facility] 07/22/2013, 6:13 AM

## 2013-07-22 NOTE — ED Notes (Signed)
ED NP at bedside

## 2013-07-22 NOTE — ED Provider Notes (Signed)
Care assumed from Sharen Hones, NP. Patient was awaiting consult from hospitalists. Hospitalist Dr. Jomarie Longs saw patient and advise CTA of chest, if CTA is negative patient will be discharged home. If it is positive, call the hospitalist back. 7:55 AM CTA negative. Discussed results with patient. He is stable for discharge. Return precautions discussed. Patient states understanding of plan and is agreeable.  Trevor Mace, PA-C 07/22/13 [phone]

## 2013-07-22 NOTE — ED Notes (Signed)
Pt told test results by Dondra Spry NP and this RN. Pt and pt's family member requesting further information and requesting pt be admitted. Explained to pt and family that results were normal and Gail planned to discharge pt. Dondra Spry NP made aware of situation.

## 2013-07-22 NOTE — ED Provider Notes (Signed)
CSN: [id number]     Arrival date & time 07/21/13  2302 History     First MD Initiated Contact with Patient 07/22/13 0304     Chief Complaint  Patient presents with  . Back Pain  . Weakness  . Shortness of Breath  . Dizziness   (Consider location/radiation/quality/duration/timing/severity/associated sxs/prior Treatment) HPI Comments: Patient is very scattered in his history, but for the past 2, days.  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  He also states, that he's been having intermittent chest pain with arm heaviness for the past, month.  He has been dizzy.  He has been urinating frequently and on several occasions.  His urine has been very dark.  He, states his appetite is although he is been sleeping poorly as he just can't get comfortable to rest. Gives a history of having had a stroke in 2005 for unknown reasons.  He does not have known hypertension although he does have a family history of the same he does not take any medications on a regular basis.  He does smoke cigarettes.  Denies any alcohol use or recreational drugs  Patient is a 42 y.o. male presenting with back pain, weakness, and shortness of breath. The history is provided by the patient.  Back Pain Location:  Lumbar spine Quality:  Aching Radiates to:  Does not radiate Pain severity:  Moderate Onset quality:  Sudden Duration:  18 hours Timing:  Constant Chronicity:  New Relieved by:  None tried Worsened by:  Nothing tried Associated symptoms: weakness   Associated symptoms: no dysuria, no fever and no headaches   Weakness Associated symptoms include weakness. Pertinent negatives include no chills, congestion, fever, headaches, nausea, rash or vomiting.  Shortness of Breath Associated symptoms: no fever, no headaches, no rash and no vomiting     Past Medical History  Diagnosis Date  . TIA (transient ischemic attack) 2005    Past Surgical History  Procedure Laterality Date  . Hernia repair     Family History  Problem Relation Age of Onset  . Hypertension Mother    History  Substance Use Topics  . Smoking status: Current Every Day Smoker -- 1.00 packs/day  . Smokeless tobacco: Not on file  . Alcohol Use: No    Review of Systems  Constitutional: Negative for fever and chills.  HENT: Negative for congestion and rhinorrhea.   Respiratory: Positive for shortness of breath.   Cardiovascular: Negative for leg swelling.  Gastrointestinal: Negative for nausea and vomiting.  Genitourinary: Negative for dysuria and decreased urine volume.  Musculoskeletal: Positive for back pain.  Skin: Negative for rash and wound.  Neurological: Positive for dizziness and weakness. Negative for headaches.  All other systems reviewed and are negative.    Allergies  Penicillins  Home Medications  No current outpatient prescriptions on file. BP 113/72  Pulse 76  Temp(Src) 98.4 F (36.9 C) (Oral)  Resp 26  SpO2 97% Physical Exam  Nursing note and vitals reviewed. Constitutional: He is oriented to person, place, and time. He appears well-developed and well-nourished.  HENT:  Head: Normocephalic and atraumatic.  Eyes: Pupils are equal, round, and reactive to light.  Cardiovascular: Normal rate and regular rhythm.   Pulmonary/Chest: Effort normal and breath sounds normal.  Oxygen saturation on room air, 100%.  Patient speaking in full.    Abdominal: Soft.  Musculoskeletal: Normal range of motion. He exhibits no  edema and no tenderness.  Full range of motion, equal, strength, bilaterally, upper and lower extremities  Neurological: He is oriented to person, place, and time.  Skin: Skin is warm and dry.    ED Course   Procedures (including critical care time)  Labs Reviewed  CBC WITH DIFFERENTIAL - Abnormal; Notable for the following:    MCHC 36.1 (*)    All other components within normal limits  POCT  I-STAT, CHEM 8 - Abnormal; Notable for the following:    Potassium 3.1 (*)    Glucose, Bld 135 (*)    All other components within normal limits  URINALYSIS, ROUTINE W REFLEX MICROSCOPIC  CK TOTAL AND CKMB  TROPONIN I  POCT I-STAT TROPONIN I   Dg Chest 2 View  07/21/2013   *RADIOLOGY REPORT*  Clinical Data: Back pain, weakness, shortness of breath and dizziness.  CHEST - 2 VIEW  Comparison: None.  Findings: Mild hyperinflation and interstitial prominence is suggestive of chronic lung disease.  No edema, infiltrate, nodule or pleural effusion is identified.  The heart size and mediastinal contours are within normal limits.  Bony thorax is unremarkable.  IMPRESSION: No acute findings.  Probable component of chronic lung disease.   Original Report Authenticated By: Irish Lack, M.D.   No diagnosis found.  MDM  She has had 2 sets of negative cardiac markers, EKG, normal.  Head, x-ray, normal.  Chest x-ray labs patient has persistent migratory myalgias on each reexamination.  He has a new complaint is unsubstantiated by lab findings or x-ray findings.  I discussed with the hospitalist, who will examine the patient and will collaborate on  Discharge plan    Arman Filter, NP 07/22/13 [phone]

## 2013-07-22 NOTE — ED Notes (Signed)
Dr. Jomarie Longs at bedside. States to hold morphine administration at this time

## 2013-07-23 NOTE — ED Provider Notes (Signed)
Medical screening examination/treatment/procedure(s) were performed by non-physician practitioner and as supervising physician I was immediately available for consultation/collaboration.  Taiana Temkin, MD 07/23/13 0439 

## 2013-07-23 NOTE — ED Provider Notes (Signed)
Medical screening examination/treatment/procedure(s) were performed by non-physician practitioner and as supervising physician I was immediately available for consultation/collaboration.  Sunnie Nielsen, MD 07/23/13 [phone]

## 2013-08-15 ENCOUNTER — Emergency Department (HOSPITAL_COMMUNITY)
Admission: EM | Admit: 2013-08-15 | Discharge: 2013-08-16 | Disposition: A | Discharge status: Home / Self Care | Payer: Self-pay | Attending: Emergency Medicine | Admitting: Emergency Medicine

## 2013-08-15 ENCOUNTER — Emergency Department (HOSPITAL_COMMUNITY): Payer: Self-pay

## 2013-08-15 ENCOUNTER — Encounter (HOSPITAL_COMMUNITY): Payer: Self-pay | Admitting: Emergency Medicine

## 2013-08-15 DIAGNOSIS — R05 Cough: Secondary | ICD-10-CM | POA: Insufficient documentation

## 2013-08-15 DIAGNOSIS — R059 Cough, unspecified: Secondary | ICD-10-CM | POA: Insufficient documentation

## 2013-08-15 DIAGNOSIS — F17200 Nicotine dependence, unspecified, uncomplicated: Secondary | ICD-10-CM | POA: Insufficient documentation

## 2013-08-15 DIAGNOSIS — Z720 Tobacco use: Secondary | ICD-10-CM

## 2013-08-15 DIAGNOSIS — Z8673 Personal history of transient ischemic attack (TIA), and cerebral infarction without residual deficits: Secondary | ICD-10-CM | POA: Insufficient documentation

## 2013-08-15 DIAGNOSIS — J440 Chronic obstructive pulmonary disease with acute lower respiratory infection: Secondary | ICD-10-CM | POA: Insufficient documentation

## 2013-08-15 DIAGNOSIS — Z880 Allergy status to penicillin: Secondary | ICD-10-CM | POA: Insufficient documentation

## 2013-08-15 DIAGNOSIS — J209 Acute bronchitis, unspecified: Secondary | ICD-10-CM | POA: Insufficient documentation

## 2013-08-15 DIAGNOSIS — Z79899 Other long term (current) drug therapy: Secondary | ICD-10-CM | POA: Insufficient documentation

## 2013-08-15 LAB — BASIC METABOLIC PANEL
BUN: 19 mg/dL (ref 6–23)
Creatinine, Ser: 0.8 mg/dL (ref 0.50–1.35)
GFR calc Af Amer: >90 mL/min (ref >90)
GFR calc non Af Amer: >90 mL/min (ref >90)

## 2013-08-15 LAB — CBC
HCT: 39.8 % (ref 39.0–52.0)
MCH: 33.2 pg (ref 26.0–34.0)
MCHC: 35.7 g/dL (ref 30.0–36.0)
MCV: 93 fL (ref 78.0–100.0)
RDW: 12.6 % (ref 11.5–15.5)

## 2013-08-15 NOTE — ED Notes (Signed)
Patient with shortness of breath and sore throat with scratchiness and loosing his voice.  Patient states that it has been going on for two weeks.

## 2013-08-16 MED ORDER — ALBUTEROL SULFATE HFA 108 (90 BASE) MCG/ACT IN AERS
1.0000 | INHALATION_SPRAY | RESPIRATORY_TRACT | Status: DC | PRN
  Administered 2013-08-16: 2 via RESPIRATORY_TRACT
  Filled 2013-08-16: qty 6.7

## 2013-08-16 MED ORDER — BENZONATATE 100 MG PO CAPS
200.0000 mg | ORAL_CAPSULE | Freq: Three times a day (TID) | ORAL | Status: DC | PRN
  Administered 2013-08-16: 200 mg via ORAL
  Filled 2013-08-16: qty 2

## 2013-08-16 MED ORDER — BENZONATATE 200 MG PO CAPS: 200.0000 mg | ORAL_CAPSULE | Freq: Three times a day (TID) | ORAL | Status: AC | PRN

## 2013-08-16 MED ORDER — ALBUTEROL SULFATE HFA 108 (90 BASE) MCG/ACT IN AERS: 1.0000 | INHALATION_SPRAY | RESPIRATORY_TRACT | Status: AC | PRN

## 2013-08-16 MED ORDER — HYDROCOD POLST-CHLORPHEN POLST 10-8 MG/5ML PO LQCR: 5.0000 mL | Freq: Two times a day (BID) | ORAL | Status: AC | PRN

## 2013-08-16 NOTE — ED Provider Notes (Signed)
CSN: [id number]     Arrival date & time 08/15/13  2204 History     First MD Initiated Contact with Patient 08/15/13 2347     Chief Complaint  Patient presents with  . Shortness of Breath   (Consider location/radiation/quality/duration/timing/severity/associated sxs/prior Treatment) HPI 42 yo male presents to the ER with complaint of cough, sob, scratchy voice for 2-3 weeks.  He has tried otc cough medication without improvement in symptoms.  Pt is 1ppd smoker.  He thinks he may have had some fevers about 2 weeks ago.  Pt reports sxs are worse with talking.  Pt seen in the ER on 7/29 with multiple complaints and negative workup.   Past Medical History  Diagnosis Date  . TIA (transient ischemic attack) 2005   Past Surgical History  Procedure Laterality Date  . Hernia repair     Family History  Problem Relation Age of Onset  . Hypertension Mother    History  Substance Use Topics  . Smoking status: Current Every Day Smoker -- 1.00 packs/day  . Smokeless tobacco: Not on file  . Alcohol Use: No    Review of Systems  All other systems reviewed and are negative.    Allergies  Penicillins  Home Medications   Current Outpatient Rx  Name  Route  Sig  Dispense  Refill  . traMADol (ULTRAM) 50 MG tablet   Oral   Take 1 tablet (50 mg total) by mouth every 6 (six) hours as needed for pain.   15 tablet   0   . albuterol (PROVENTIL HFA;VENTOLIN HFA) 108 (90 BASE) MCG/ACT inhaler   Inhalation   Inhale 1-2 puffs into the lungs every 4 (four) hours as needed for wheezing or shortness of breath.   1 Inhaler   1   . benzonatate (TESSALON) 200 MG capsule   Oral   Take 1 capsule (200 mg total) by mouth 3 (three) times daily as needed for cough.   20 capsule   0   . chlorpheniramine-HYDROcodone (TUSSIONEX PENNKINETIC ER) 10-8 MG/5ML LQCR   Oral   Take 5 mLs by mouth every 12 (twelve) hours as needed (for cough).   140 mL   0    BP 115/81  Pulse 89  Temp(Src) 97.5 F  (36.4 C) (Oral)  Resp 16  SpO2 97% Physical Exam  Nursing note and vitals reviewed. Constitutional: He is oriented to person, place, and time. He appears well-developed and well-nourished.  HENT:  Head: Normocephalic and atraumatic.  Right Ear: External ear normal.  Left Ear: External ear normal.  Nose: Nose normal.  Mouth/Throat: Oropharynx is clear and moist.  Hoarse voice  Eyes: Conjunctivae and EOM are normal. Pupils are equal, round, and reactive to light.  Neck: Normal range of motion. Neck supple. No JVD present. No tracheal deviation present. No thyromegaly present.  Cardiovascular: Normal rate, regular rhythm, normal heart sounds and intact distal pulses.  Exam reveals no gallop and no friction rub.   No murmur heard. Pulmonary/Chest: Effort normal. No stridor. No respiratory distress. He has wheezes. He has no rales. He exhibits no tenderness.  Abdominal: Soft. Bowel sounds are normal. He exhibits no distension and no mass. There is no tenderness. There is no rebound and no guarding.  Musculoskeletal: Normal range of motion. He exhibits no edema and no tenderness.  Lymphadenopathy:    He has no cervical adenopathy.  Neurological: He is alert and oriented to person, place, and time. He exhibits normal muscle tone. Coordination normal.  Skin: Skin is warm and dry. No rash noted. No erythema. No pallor.  Psychiatric: He has a normal mood and affect. His behavior is normal. Judgment and thought content normal.    ED Course   Procedures (including critical care time)  Labs Reviewed  BASIC METABOLIC PANEL - Abnormal; Notable for the following:    Glucose, Bld 112 (*)    All other components within normal limits  CBC   Dg Chest 2 View  08/15/2013   *RADIOLOGY REPORT*  Clinical Data: Shortness of breath.  Cough.  Loss of breath and voice.  Smoker.  CHEST - 2 VIEW  Comparison: 07/21/2013  Findings: The normal heart size and pulmonary vascularity.  Central interstitial changes  with peribronchial thickening consistent with chronic bronchitis.  No focal consolidation or airspace disease in the lungs.  No blunting of costophrenic angles.  No pneumothorax. Mediastinal contours appear intact.  No significant change since previous study.  IMPRESSION: Bronchitic changes in the lungs.  No evidence of active pulmonary disease.   Original Report Authenticated By: Burman Nieves, M.D.    Date: 08/16/2013  Rate: 86  Rhythm: normal sinus rhythm  QRS Axis: normal  Intervals: normal  ST/T Wave abnormalities: normal  Conduction Disutrbances:none  Narrative Interpretation:   Old EKG Reviewed: unchanged    1. Bronchitis, chronic obstructive w acute bronchitis   2. Tobacco abuse     MDM  42 yo male with ongoing cough, hoarse voice, bronchitic changes on cxr.  Will treat with tessalon, tussinex, and albuterol.  Pt counseled on smoking cessation.  Olivia Mackie, MD 08/16/13 [phone]

## 2014-02-02 ENCOUNTER — Emergency Department (HOSPITAL_COMMUNITY)
Admission: EM | Admit: 2014-02-02 | Discharge: 2014-02-02 | Disposition: A | Discharge status: Home / Self Care | Payer: Self-pay | Attending: Emergency Medicine | Admitting: Emergency Medicine

## 2014-02-02 ENCOUNTER — Encounter (HOSPITAL_COMMUNITY): Payer: Self-pay | Admitting: Emergency Medicine

## 2014-02-02 DIAGNOSIS — Z8739 Personal history of other diseases of the musculoskeletal system and connective tissue: Secondary | ICD-10-CM | POA: Insufficient documentation

## 2014-02-02 DIAGNOSIS — Z880 Allergy status to penicillin: Secondary | ICD-10-CM | POA: Insufficient documentation

## 2014-02-02 DIAGNOSIS — Y9389 Activity, other specified: Secondary | ICD-10-CM | POA: Insufficient documentation

## 2014-02-02 DIAGNOSIS — T1590XA Foreign body on external eye, part unspecified, unspecified eye, initial encounter: Secondary | ICD-10-CM | POA: Insufficient documentation

## 2014-02-02 DIAGNOSIS — Z8673 Personal history of transient ischemic attack (TIA), and cerebral infarction without residual deficits: Secondary | ICD-10-CM | POA: Insufficient documentation

## 2014-02-02 DIAGNOSIS — R51 Headache: Secondary | ICD-10-CM | POA: Insufficient documentation

## 2014-02-02 DIAGNOSIS — Y990 Civilian activity done for income or pay: Secondary | ICD-10-CM | POA: Insufficient documentation

## 2014-02-02 DIAGNOSIS — Z79899 Other long term (current) drug therapy: Secondary | ICD-10-CM | POA: Insufficient documentation

## 2014-02-02 DIAGNOSIS — F17200 Nicotine dependence, unspecified, uncomplicated: Secondary | ICD-10-CM | POA: Insufficient documentation

## 2014-02-02 DIAGNOSIS — Y9289 Other specified places as the place of occurrence of the external cause: Secondary | ICD-10-CM | POA: Insufficient documentation

## 2014-02-02 DIAGNOSIS — T1500XA Foreign body in cornea, unspecified eye, initial encounter: Secondary | ICD-10-CM | POA: Insufficient documentation

## 2014-02-02 DIAGNOSIS — J3489 Other specified disorders of nose and nasal sinuses: Secondary | ICD-10-CM | POA: Insufficient documentation

## 2014-02-02 HISTORY — DX: Reserved for concepts with insufficient information to code with codable children: IMO0002

## 2014-02-02 MED ORDER — TETRACAINE HCL 0.5 % OP SOLN
OPHTHALMIC | Status: AC
  Administered 2014-02-02: 21:00:00
  Filled 2014-02-02: qty 2

## 2014-02-02 MED ORDER — ERYTHROMYCIN 5 MG/GM OP OINT
TOPICAL_OINTMENT | Freq: Once | OPHTHALMIC | Status: AC
  Administered 2014-02-02: 21:00:00 via OPHTHALMIC
  Filled 2014-02-02: qty 3.5

## 2014-02-02 MED ORDER — KETOROLAC TROMETHAMINE 0.5 % OP SOLN
1.0000 [drp] | Freq: Four times a day (QID) | OPHTHALMIC | Status: DC
  Administered 2014-02-02: 1 [drp] via OPHTHALMIC
  Filled 2014-02-02: qty 5

## 2014-02-02 NOTE — Discharge Instructions (Signed)
Eye, Foreign Body The term foreign body refers to any object near, on the surface of or in the eye that should not be there. A foreign body may be a small speck of dirt or dust, a hair or eyelash, a splinter or any object. CAUSES  Foreign bodies can get in the eye by:  Flying pieces of something that was broken or destroyed (debris).  A sudden injury (trauma) to the eye. SYMPTOMS  Symptoms depend on what the foreign body is and where it is in the eye. The most common locations are:  On the inner surface of the upper or lower eyelids or on the covering of the white part of the eye (conjunctiva). Symptoms in this location are:  Irritating and painful, especially when blinking.  Feeling like something is in the eye.  On the surface of the clear covering on the front of the eye (cornea). A corneal foreign body has symptoms that:  Are painful and irritating since the cornea is very sensitive.  Form small "rust rings" around a metallic foreign body. Metallic foreign bodies stick more firmly to the surface of the cornea.  Inside the eyeball. Infection can happen fast and can be hard to treat with antibiotics. This is an extremely dangerous situation. Foreign bodies inside the eye can threaten vision. A person may even loose their eye. Foreign bodies inside the eye may cause:  Great pain.  Immediate loss of vision. DIAGNOSIS  Foreign bodies are found during an exam by an eye specialist. Those that are on the eyelids, conjunctiva or cornea are usually (but not always) easily found. When a foreign body is inside the eyeball, a cataract may form almost right away. This makes it hard for an ophthalmologist to find the foreign body. Special tests may be needed, including ultrasound testing, X-rays and CT scans. TREATMENT   Foreign bodies that are on the eyelids, conjunctiva or cornea are often removed easily and painlessly.  If the foreign body has caused a scratch or abrasion of the cornea,  antibiotic drops, ointments and/or a tight patch called a "pressure patch" may be needed. Follow-up exams will be needed for several days until the abrasion heals.  Surgery is needed right away if the foreign body is inside the eyeball. This is a medical emergency. An antibiotic therapy will likely be given to stop an infection. HOME CARE INSTRUCTIONS  The use of eye patches is not universal. Their use varies from state to state and from caregiver to caregiver. If an eye patch was applied:  Keep the eye patch on for as long as directed by your caregiver until the follow-up appointment.  Do not remove the patch to put in medications unless instructed to do so. When replacing the patch, retape it as it was before. Follow the same procedure if the patch becomes loose.  WARNING: Do not drive or operate machinery while the eye is patched. The ability to judge distances will be impaired.  Only take over-the-counter or prescription medicines for pain, discomfort or fever as directed by the caregiver. If no eye patch was applied:  Keep the eye closed as much as possible. Do not rub the eye.  Wear dark glasses as needed to protect the eyes from bright light.  Do not wear contact lenses until the eye feels normal again, or as instructed.  Wear protective eye covering if there is a risk of eye injury. This is important when working with high speed tools.  Only take over-the-counter or   prescription medicines for pain, discomfort or fever as directed by the caregiver. SEEK IMMEDIATE MEDICAL CARE IF:   Pain increases in the eye or the vision changes.  You or your child has problems with the eye patch.  The injury to the eye appears to be getting larger.  There is discharge from the injured eye.  Swelling and/or soreness (inflammation) develops around the affected eye.  You or your child has an oral temperature above 102 F (38.9 C), not controlled by medicine.  Your baby is older than 3  months with a rectal temperature of 102 F (38.9 C) or higher.  Your baby is 403 months old or younger with a rectal temperature of 100.4 F (38 C) or higher. MAKE SURE YOU:   Understand these instructions.  Will watch your condition.  Will get help right away if you are not doing well or get worse. Document Released: 12/11/2005 Document Revised: 03/04/2012 Document Reviewed: 05/08/2013 [facility] Patient Information 2014 [place], [place].   You may apply one drop of the ketorolac eye drop every 4 hours to your left eye - this is an anti - inflammatory pain  Reliever.  Apply one small ribbon of the antibiotic ointment twice daily.  See Dr. Lita MainsHaines tomorrow.

## 2014-02-02 NOTE — ED Notes (Addendum)
Per patient c/o foreign object in left eye. Per patient was working under house yesterday. Unsure of what is in eye but states "I can feel it grinding on my eye." Patient's sclera red. Patient reports clear drainage form eye.

## 2014-02-03 NOTE — ED Provider Notes (Signed)
CSN: [id number]     Arrival date & time 02/02/14  1732 History   First MD Initiated Contact with Patient 02/02/14 2026     Chief Complaint  Patient presents with  . Foreign Body in Eye     (Consider location/radiation/quality/duration/timing/severity/associated sxs/prior Treatment) Patient is a 43 y.o. male presenting with foreign body in eye. The history is provided by the patient.  Foreign Body in Eye This is a new problem. 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 The problem occurs constantly. The problem has been unchanged. Associated symptoms include headaches and a visual change. Pertinent negatives include no arthralgias, chest pain, congestion, fever, joint swelling, nausea, neck pain, numbness, rash, sore throat or weakness. Associated symptoms comments: Vision in the left eye is blurry due to copious tears and reports is also light sensitive.. Exacerbated by: blinking. Treatments tried: visine. The treatment provided no relief.    Past Medical History  Diagnosis Date  . TIA (transient ischemic attack) 2005  . Disc degeneration    Past Surgical History  Procedure Laterality Date  . Hernia repair     Family History  Problem Relation Age of Onset  . Hypertension Mother    History  Substance Use Topics  . Smoking status: Current Every Day Smoker -- 1.00 packs/day for 4 years    Types: Cigarettes  . Smokeless tobacco: Never Used  . Alcohol Use: No    Review of Systems  Constitutional: Negative for fever.  HENT: Positive for rhinorrhea. Negative for congestion, ear pain, facial swelling and sore throat.   Eyes: Positive for photophobia, pain, redness and visual disturbance.  Respiratory: Negative for shortness of breath.   Cardiovascular: Negative for chest pain.  Gastrointestinal: Negative for nausea.  Genitourinary: Negative.   Musculoskeletal: Negative for arthralgias,  joint swelling and neck pain.  Skin: Negative.  Negative for rash and wound.  Neurological: Positive for headaches. Negative for dizziness, weakness, light-headedness and numbness.  Psychiatric/Behavioral: Negative.       Allergies  Penicillins  Home Medications   Current Outpatient Rx  Name  Route  Sig  Dispense  Refill  . albuterol (PROVENTIL HFA;VENTOLIN HFA) 108 (90 BASE) MCG/ACT inhaler   Inhalation   Inhale 1-2 puffs into the lungs every 4 (four) hours as needed for wheezing or shortness of breath.   1 Inhaler   1   . benzonatate (TESSALON) 200 MG capsule   Oral   Take 1 capsule (200 mg total) by mouth 3 (three) times daily as needed for cough.   20 capsule   0   . chlorpheniramine-HYDROcodone (TUSSIONEX PENNKINETIC ER) 10-8 MG/5ML LQCR   Oral   Take 5 mLs by mouth every 12 (twelve) hours as needed (for cough).   140 mL   0   . traMADol (ULTRAM) 50 MG tablet   Oral   Take 1 tablet (50 mg total) by mouth every 6 (six) hours as needed for pain.   15 tablet   0    BP 113/71  Pulse 85  Temp(Src) 98.4 F (36.9 C) (Oral)  Resp 20  Ht 5\' 9"  (1.753 m)  Wt 194 lb (87.998 kg)  BMI 28.64 kg/m2  SpO2 98% Physical Exam  Nursing note and vitals reviewed. Constitutional: He appears well-developed and well-nourished.  HENT:  Head: Normocephalic and atraumatic.  Eyes: Pupils are equal, round, and reactive to light. Left eye exhibits no chemosis, no discharge  and no exudate. Foreign body present in the left eye. Left conjunctiva is injected. Left eye exhibits normal extraocular motion.  Slit lamp exam:      The left eye shows foreign body and fluorescein uptake.  Neck: Normal range of motion.  Cardiovascular: Normal rate.   Musculoskeletal: Normal range of motion.  Neurological: He is alert.  Skin: Skin is warm and dry.  Psychiatric: He has a normal mood and affect.   Visual acuity not obtained during this visit.  ED Course  FOREIGN BODY REMOVAL Date/Time:  02/02/2014 8:45 PM Performed by: Burgess Amor Authorized by: Burgess Amor Consent: Verbal consent obtained. Risks and benefits: risks, benefits and alternatives were discussed Consent given by: patient Patient identity confirmed: verbally with patient Time out: Immediately prior to procedure a "time out" was called to verify the correct patient, procedure, equipment, support staff and site/side marked as required. Body area: eye Location details: left cornea Local anesthetic: tetracaine drops Anesthetic total: 4 drops Patient sedated: no Removal mechanism: moist cotton swab (cotton swab removed small fb easily,  deeper more embedded fb beneath the first fb not amenable to remvoval using insulin syringe) Eye examined with fluorescein. Fluorescein uptake. Corneal abrasion size: small Corneal abrasion location: medial No residual rust ring present. Dressing: eye patch and antibiotic ointment Depth: superficial 1 objects recovered. Objects recovered: debris,  possible tiny wood splinter Post-procedure assessment: residual foreign bodies remain Patient tolerance: Patient tolerated the procedure well with no immediate complications.   (including critical care time) Labs Review Labs Reviewed - No data to display Imaging Review No results found.  EKG Interpretation   None       MDM   Final diagnoses:  Eye foreign body    Discussed patient with Dr. Estell Harpin who recommended eye patch, abx ointment, f/u with ophthalmology in am.  Pt referred to Dr Lita Mains - he will call in am for appt .  Eye was patched after apply a drop of ketolac drops for pain relief, erythromycin ointment ribbon also placed by RN.  The patient appears reasonably screened and/or stabilized for discharge and I doubt any other medical condition or other [facility] requiring further screening, evaluation, or treatment in the ED at this time prior to discharge.      Burgess Amor, PA-C 02/03/14 1514

## 2014-02-05 NOTE — ED Provider Notes (Signed)
Medical screening examination/treatment/procedure(s) were performed by non-physician practitioner and as supervising physician I was immediately available for consultation/collaboration.  EKG Interpretation   None         Crystalmarie Yasin L Kasey Ewings, MD 02/05/14 1434 

## 2014-02-26 ENCOUNTER — Ambulatory Visit (HOSPITAL_COMMUNITY)
Admission: RE | Admit: 2014-02-26 | Discharge: 2014-02-26 | Disposition: A | Discharge status: Home / Self Care | Payer: Disability Insurance | Source: Ambulatory Visit | Attending: Family Medicine | Admitting: Family Medicine

## 2014-02-26 ENCOUNTER — Other Ambulatory Visit (HOSPITAL_COMMUNITY): Payer: Self-pay | Admitting: Family Medicine

## 2014-02-26 DIAGNOSIS — M545 Low back pain, unspecified: Secondary | ICD-10-CM

## 2014-11-03 ENCOUNTER — Telehealth: Payer: Self-pay | Admitting: *Deleted

## 2014-11-04 NOTE — Telephone Encounter (Signed)
error 

## 2015-01-18 IMAGING — CR DG CHEST 2V
2 series · 2 of 2 positions shown · non-contrast
Comparison: None.

CLINICAL DATA: Back pain, weakness, shortness of breath and
dizziness.

CHEST - 2 VIEW

[w chest pa]
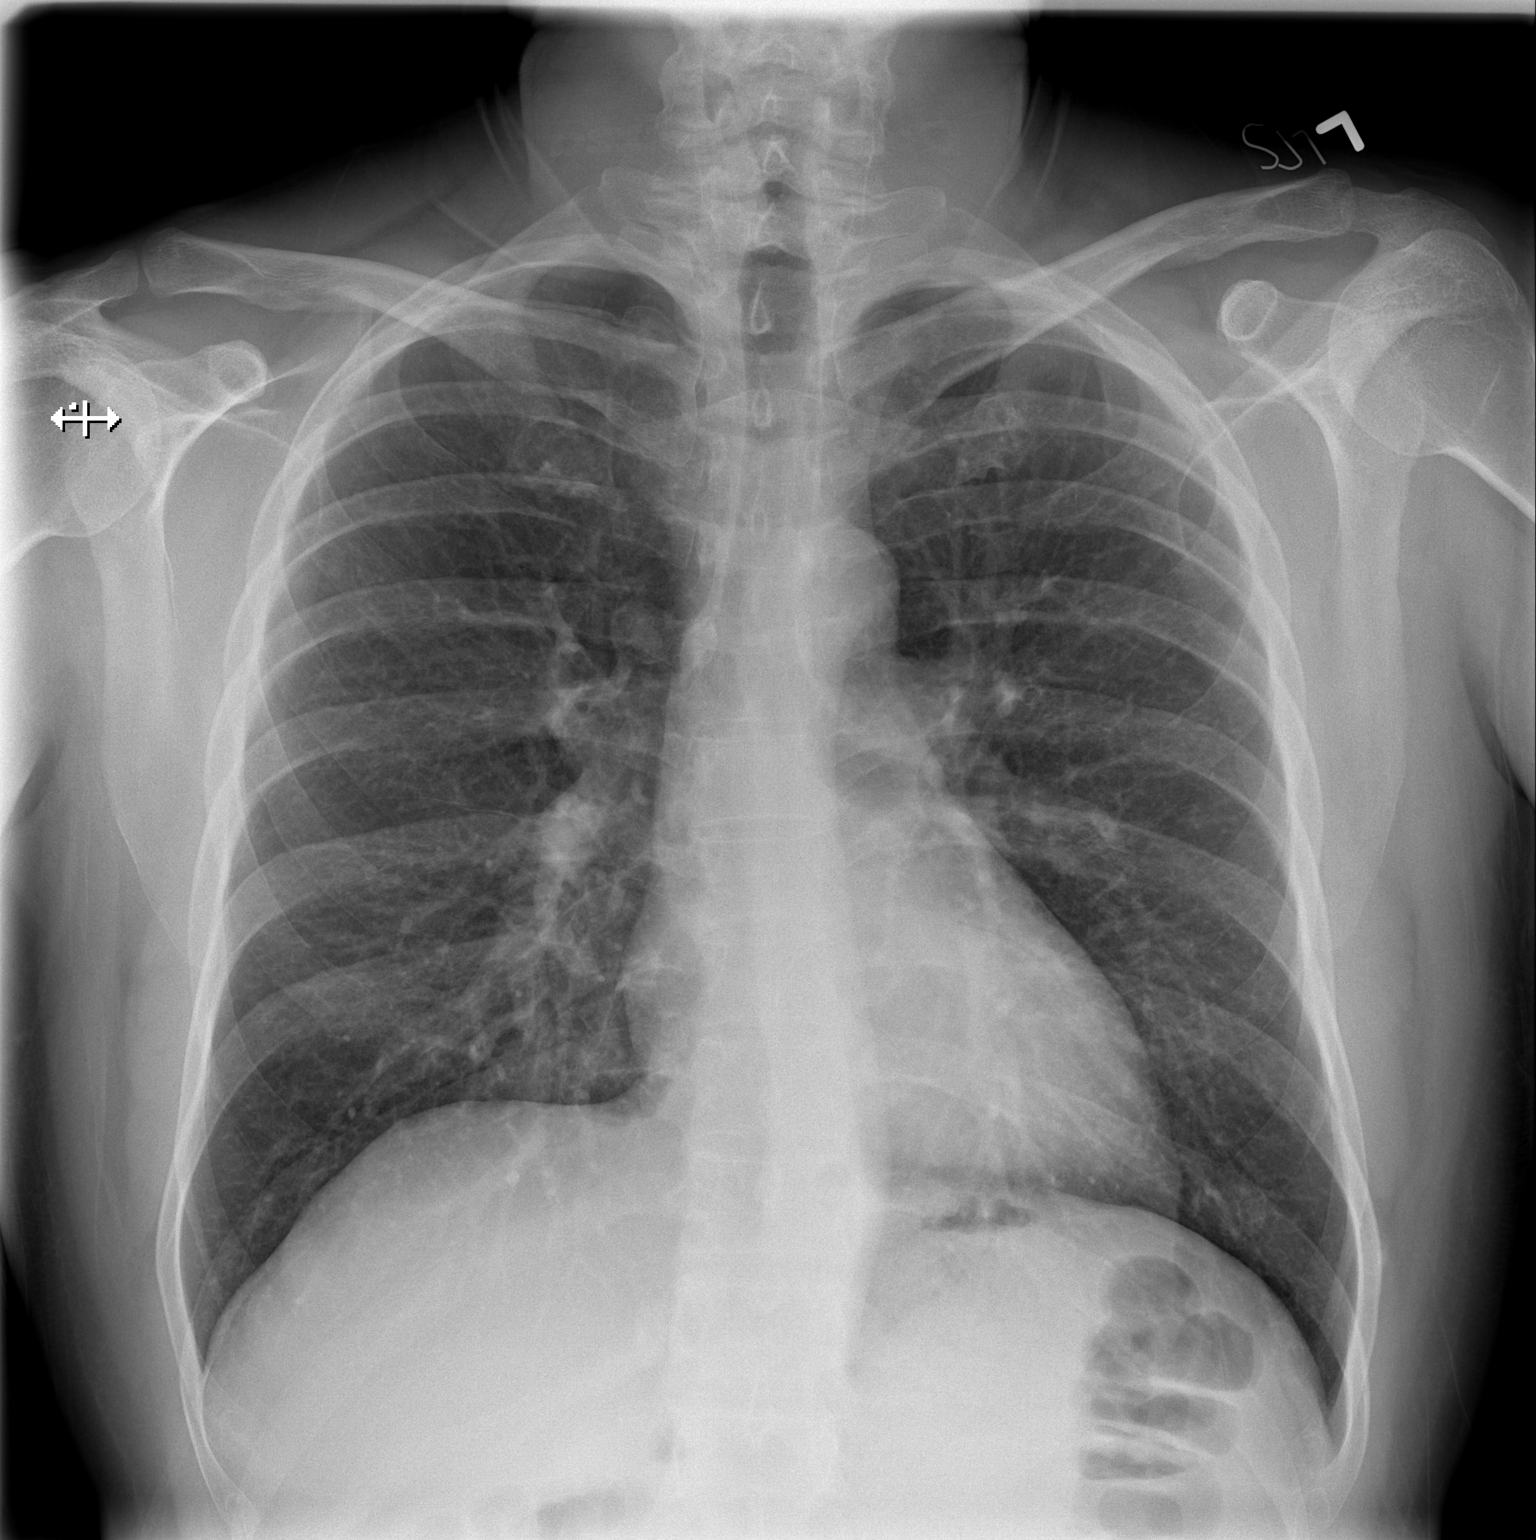

[w chest lat]
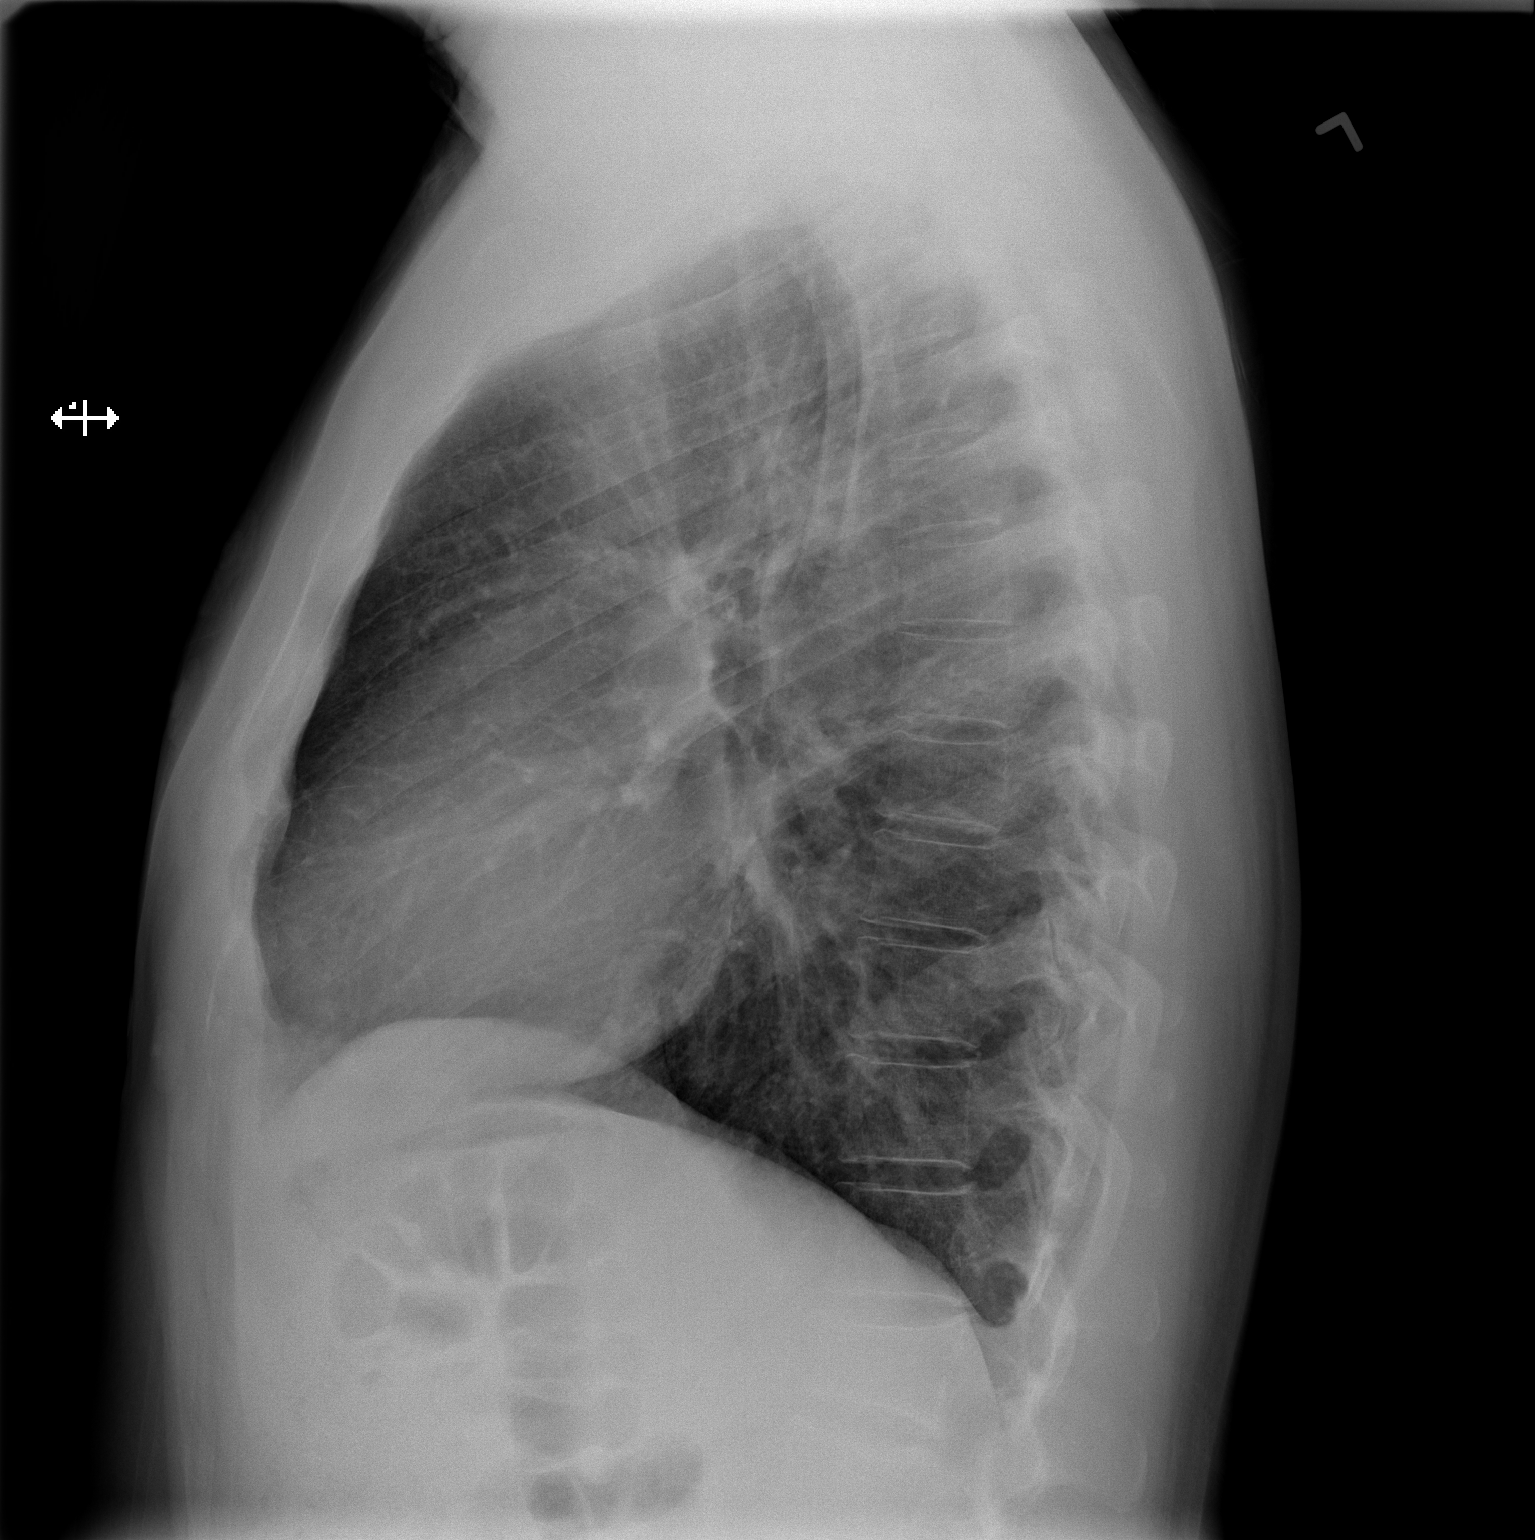

[2 of 2 positions shown; findings below may reference images not displayed]

FINDINGS: Mild hyperinflation and interstitial prominence is
suggestive of chronic lung disease.  No edema, infiltrate, nodule
or pleural effusion is identified.  The heart size and mediastinal
contours are within normal limits.  Bony thorax is unremarkable.
IMPRESSION: No acute findings.  Probable component of chronic lung disease.

## 2015-02-12 IMAGING — CR DG CHEST 2V
2 series · 2 of 2 positions shown · non-contrast
Comparison: 07/21/2013

CLINICAL DATA: Shortness of breath.  Cough.  Loss of breath and
voice.  Smoker.

CHEST - 2 VIEW

[w chest pa]
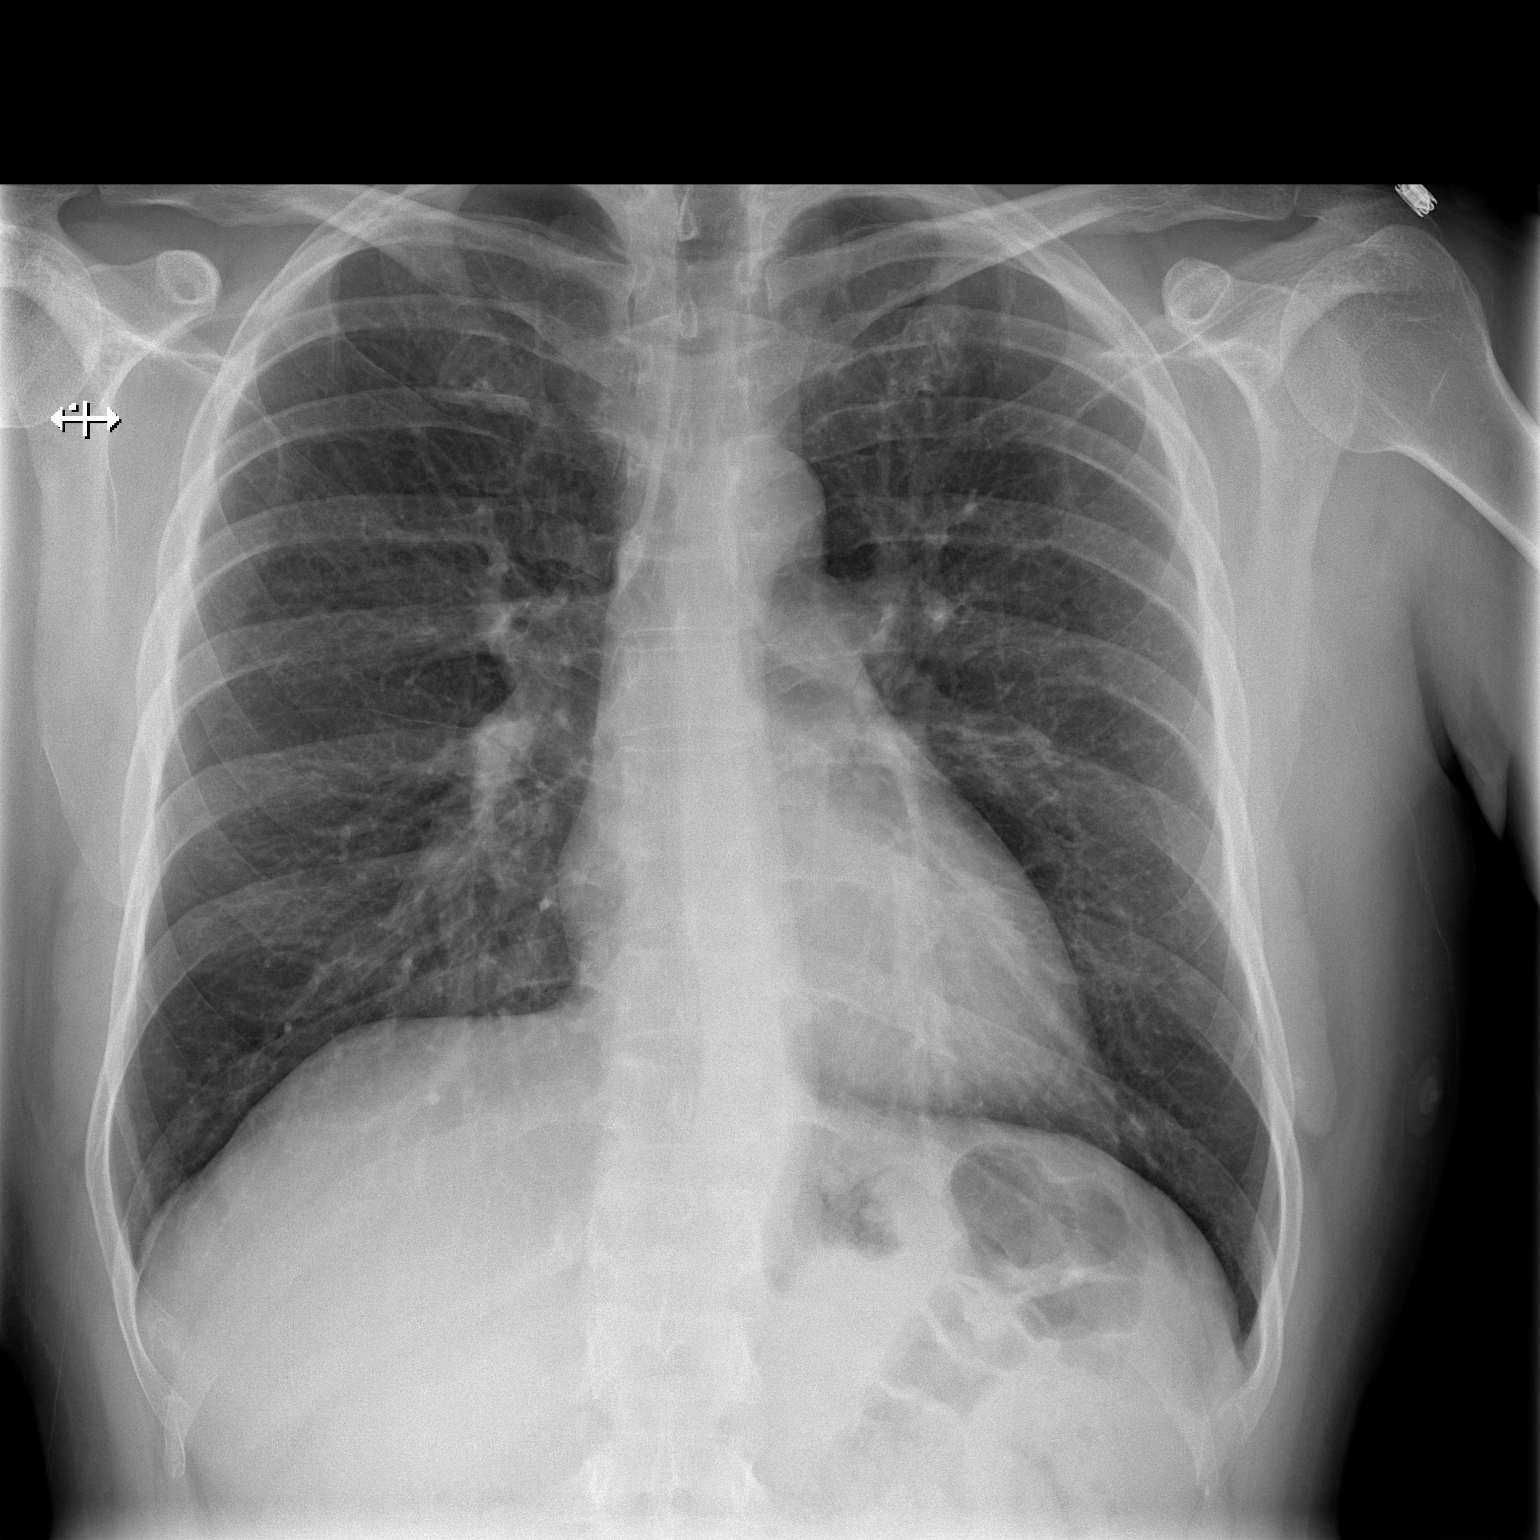

[w chest lat]
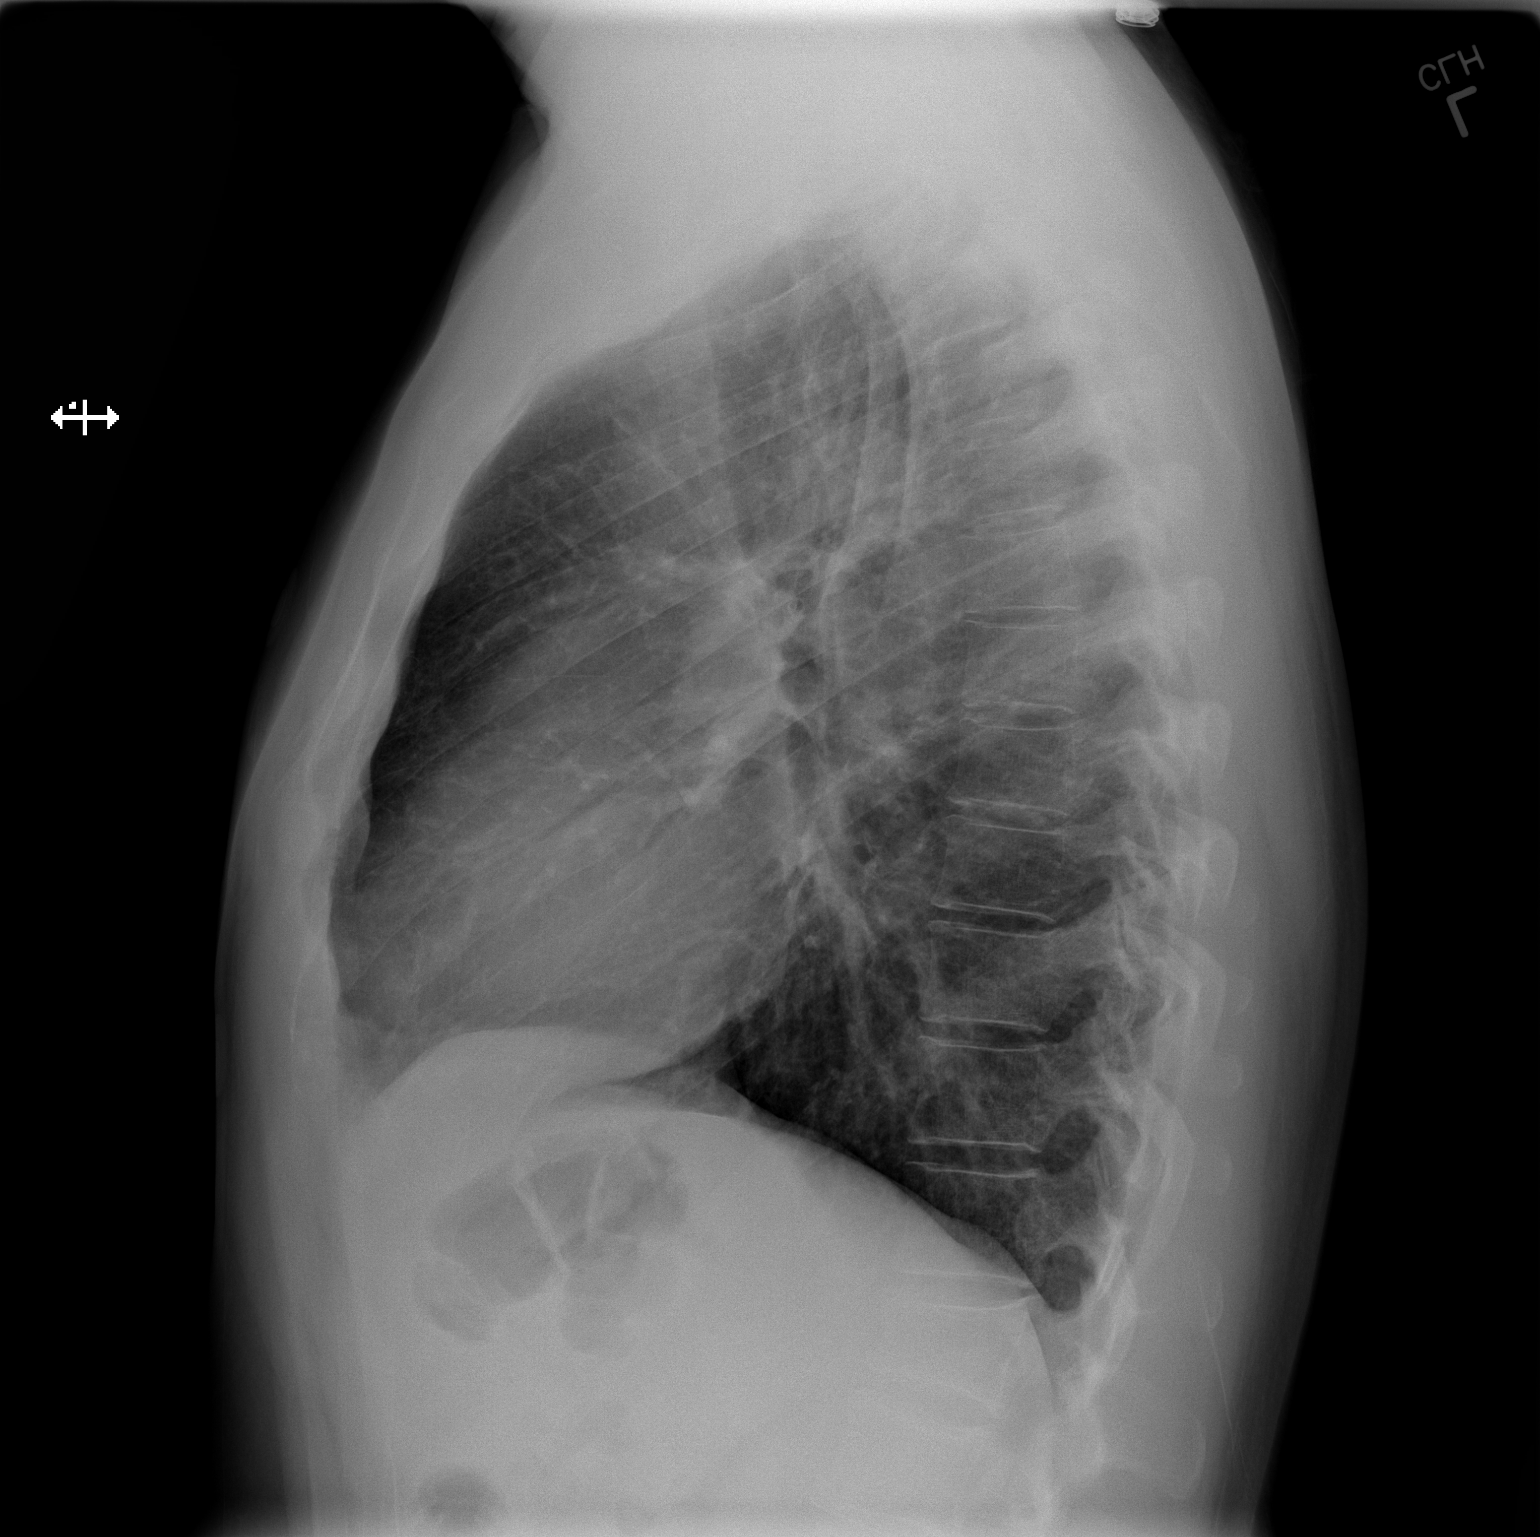

[2 of 2 positions shown; findings below may reference images not displayed]

FINDINGS: The normal heart size and pulmonary vascularity.  Central
interstitial changes with peribronchial thickening consistent with
chronic bronchitis.  No focal consolidation or airspace disease in
the lungs.  No blunting of costophrenic angles.  No pneumothorax.
Mediastinal contours appear intact.  No significant change since
previous study.
IMPRESSION: Bronchitic changes in the lungs.  No evidence of active pulmonary
disease.

## 2015-04-12 ENCOUNTER — Emergency Department (HOSPITAL_COMMUNITY)
Admission: EM | Admit: 2015-04-12 | Discharge: 2015-04-12 | Disposition: A | Discharge status: Home / Self Care | Payer: Self-pay | Attending: Emergency Medicine | Admitting: Emergency Medicine

## 2015-04-12 ENCOUNTER — Encounter (HOSPITAL_COMMUNITY): Payer: Self-pay | Admitting: Emergency Medicine

## 2015-04-12 DIAGNOSIS — M1990 Unspecified osteoarthritis, unspecified site: Secondary | ICD-10-CM | POA: Insufficient documentation

## 2015-04-12 DIAGNOSIS — Z880 Allergy status to penicillin: Secondary | ICD-10-CM | POA: Insufficient documentation

## 2015-04-12 DIAGNOSIS — Z79899 Other long term (current) drug therapy: Secondary | ICD-10-CM | POA: Insufficient documentation

## 2015-04-12 DIAGNOSIS — Z720 Tobacco use: Secondary | ICD-10-CM | POA: Insufficient documentation

## 2015-04-12 DIAGNOSIS — Z8673 Personal history of transient ischemic attack (TIA), and cerebral infarction without residual deficits: Secondary | ICD-10-CM | POA: Insufficient documentation

## 2015-04-12 DIAGNOSIS — L0291 Cutaneous abscess, unspecified: Secondary | ICD-10-CM

## 2015-04-12 DIAGNOSIS — L0231 Cutaneous abscess of buttock: Secondary | ICD-10-CM | POA: Insufficient documentation

## 2015-04-12 MED ORDER — SULFAMETHOXAZOLE-TRIMETHOPRIM 800-160 MG PO TABS: 1.0000 | ORAL_TABLET | Freq: Two times a day (BID) | ORAL | Status: AC

## 2015-04-12 MED ORDER — LIDOCAINE-EPINEPHRINE 2 %-1:200000 IJ SOLN
10.0000 mL | Freq: Once | INTRAMUSCULAR | Status: AC
Start: 2015-04-12 — End: 2015-04-12
  Administered 2015-04-12: 10 mL via INTRADERMAL
  Filled 2015-04-12: qty 20

## 2015-04-12 MED ORDER — HYDROCODONE-ACETAMINOPHEN 5-325 MG PO TABS: 2.0000 | ORAL_TABLET | ORAL | Status: DC | PRN

## 2015-04-12 NOTE — ED Notes (Addendum)
Chaperoned PA Neva SeatGreene with I&D procedure.

## 2015-04-12 NOTE — ED Provider Notes (Signed)
CSN: [id number]     Arrival date & time 04/12/15  1054 History   First MD Initiated Contact with Patient 04/12/15 1234     Chief Complaint  Patient presents with  . Abscess     (Consider location/radiation/quality/duration/timing/severity/associated sxs/prior Treatment) HPI    PCP: No PCP Per Patient Blood pressure 126/84, pulse 69, temperature 98.3 F (36.8 C), temperature source Oral, resp. rate 20, height 5\' 9"  (1.753 m), weight 185 lb (83.915 kg), SpO2 100 %.  Joe Mendez is a 44 y.o.male with a significant PMH of TIA, disc generation presents to the ER with complaints of abscess to buttock. His symptoms started a month ago but over the past week it has started to become painful and grow in size. He denies ever having one before. He has not had any difficulties with urination or bowel movement. No testicular pain, abdominal pain, nausea, vomiting or fevers. He denies noticing any fluid draining from the area. 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   Past Medical History  Diagnosis Date  . TIA (transient ischemic attack) 2005  . Disc degeneration    Past Surgical History  Procedure Laterality Date  . Hernia repair     Family History  Problem Relation Age of Onset  . Hypertension Mother    History  Substance Use Topics  . Smoking status: Current Every Day Smoker -- 1.00 packs/day for 4 years    Types: Cigarettes  . Smokeless tobacco: Never Used  . Alcohol Use: No    Review of Systems  10 Systems reviewed and are negative for acute change except as noted in the HPI.     Allergies  Penicillins  Home Medications   Prior to Admission medications   Medication Sig Start Date End Date Taking? Authorizing Provider  albuterol (PROVENTIL HFA;VENTOLIN HFA) 108 (90 BASE) MCG/ACT inhaler Inhale 1-2 puffs into the lungs every 4 (four) hours as needed for wheezing or shortness of breath. 08/16/13   Marisa Severinlga Otter, MD  benzonatate (TESSALON) 200  MG capsule Take 1 capsule (200 mg total) by mouth 3 (three) times daily as needed for cough. 08/16/13   Marisa Severinlga Otter, MD  chlorpheniramine-HYDROcodone [facility](TUSSIONEX PENNKINETIC ER) 10-8 MG/5ML LQCR Take 5 mLs by mouth every 12 (twelve) hours as needed (for cough). 08/16/13   Marisa Severinlga Otter, MD  HYDROcodone-acetaminophen (NORCO/VICODIN) 5-325 MG per tablet Take 2 tablets by mouth every 4 (four) hours as needed. 04/12/15   Shailey Butterbaugh Neva SeatGreene, PA-C  sulfamethoxazole-trimethoprim (BACTRIM DS,SEPTRA DS) 800-160 MG per tablet Take 1 tablet by mouth 2 (two) times daily. 04/12/15 04/19/15  Marlon Peliffany Daily Doe, PA-C  traMADol (ULTRAM) 50 MG tablet Take 1 tablet (50 mg total) by mouth every 6 (six) hours as needed for pain. 07/22/13   Earley FavorGail Schulz, NP   BP 121/79 mmHg  Pulse 72  Temp(Src) 98 F (36.7 C) (Oral)  Resp 16  Ht 5\' 9"  (1.753 m)  Wt 185 lb (83.915 kg)  BMI 27.31 kg/m2  SpO2 97% Physical Exam  Constitutional: He appears well-developed and well-nourished. No distress.  HENT:  Head: Normocephalic and atraumatic.  Eyes: Pupils are equal, round, and reactive to light.  Neck: Normal range of motion. Neck supple.  Cardiovascular: Normal rate and regular rhythm.   Pulmonary/Chest: Effort normal.  Abdominal: Soft. There is no tenderness. There is no rigidity, no rebound and no guarding.  Genitourinary:  No rectal involvement, no testicular pain or involvement. Abscess 1.5 cm in diameter with  central fluctuance present to the left gluteal fold  Neurological: He is alert.  Skin: Skin is warm and dry.  Nursing note and vitals reviewed.  ED Course  Procedures (including critical care time) Labs Review Labs Reviewed - No data to display  Imaging Review No results found.   EKG Interpretation None     MDM   Final diagnoses:  Abscess   Pt is overall well appearing without any systemic signs of infection. No rectal, perineum or testicular involvement.   INCISION AND DRAINAGE Performed by: Dorthula Matas Consent: Verbal consent obtained. Risks and benefits: risks, benefits and alternatives were discussed Type: abscess  Body area: left gluteal fold  Anesthesia: local infiltration  Incision was made with a scalpel.  Local anesthetic: lidocaine 2% wo epinephrine  Anesthetic total: 3 ml  Complexity: complex Blunt dissection to break up loculations  Drainage: purulent  Drainage amount: moderate  Patient tolerance: Patient tolerated the procedure well with no immediate complications.  Medications  lidocaine-EPINEPHrine (XYLOCAINE W/EPI) 2 %-1:200000 (PF) injection 10 mL (10 mLs Intradermal Given 04/12/15 1138)   HYDROcodone-acetaminophen (NORCO/VICODIN) 5-325 MG per tablet Take 2 tablets by mouth every 4 (four) hours as needed. 6 tablet Marlon Pel, PA-C    sulfamethoxazole-trimethoprim (BACTRIM DS,SEPTRA DS) 800-160 MG per tablet Take 1 tablet by mouth 2 (two) times daily. 14 tablet Marlon Pel, PA-C    43 y.o.Joe Mendez's evaluation in the Emergency Department is complete. It has been determined that no acute conditions requiring further emergency intervention are present at this time. The patient/guardian have been advised of the diagnosis and plan. We have discussed signs and symptoms that warrant return to the ED, such as changes or worsening in symptoms.  Vital signs are stable at discharge. Filed Vitals:   04/12/15 1326  BP: 121/79  Pulse: 72  Temp: 98 F (36.7 C)  Resp: 16    Patient/guardian has voiced understanding and agreed to follow-up with the PCP or specialist.  I personally performed the services described in this documentation, which was scribed in my presence. The recorded information has been reviewed and is accurate.    Marlon Pel, PA-C 04/13/15 1458  Lorre Nick, MD 04/14/15 1002

## 2015-04-12 NOTE — Discharge Instructions (Signed)

## 2015-04-12 NOTE — ED Notes (Signed)
Pt c/o right buttocks abscess worse over last 4 days

## 2015-04-12 NOTE — ED Notes (Signed)
C/O "sore" right buttock. Onset 1 week ago.

## 2016-03-18 ENCOUNTER — Encounter (HOSPITAL_COMMUNITY): Payer: Self-pay | Admitting: Emergency Medicine

## 2016-03-18 ENCOUNTER — Emergency Department (INDEPENDENT_AMBULATORY_CARE_PROVIDER_SITE_OTHER)
Admission: EM | Admit: 2016-03-18 | Discharge: 2016-03-18 | Disposition: A | Discharge status: Home / Self Care | Payer: Self-pay | Source: Home / Self Care | Attending: Family Medicine | Admitting: Family Medicine

## 2016-03-18 DIAGNOSIS — H6983 Other specified disorders of Eustachian tube, bilateral: Secondary | ICD-10-CM

## 2016-03-18 DIAGNOSIS — H66002 Acute suppurative otitis media without spontaneous rupture of ear drum, left ear: Secondary | ICD-10-CM

## 2016-03-18 DIAGNOSIS — J069 Acute upper respiratory infection, unspecified: Secondary | ICD-10-CM

## 2016-03-18 DIAGNOSIS — T700XXA Otitic barotrauma, initial encounter: Secondary | ICD-10-CM

## 2016-03-18 MED ORDER — CLINDAMYCIN HCL 300 MG PO CAPS
300.0000 mg | ORAL_CAPSULE | Freq: Three times a day (TID) | ORAL | Status: AC
Start: 2016-03-18 — End: ?

## 2016-03-18 MED ORDER — HYDROCODONE-ACETAMINOPHEN 5-325 MG PO TABS: 1.0000 | ORAL_TABLET | ORAL | Status: DC | PRN

## 2016-03-18 NOTE — ED Notes (Signed)
C/o cold sx onset Tuesday associated w/left ear pain and sinus pressure/congestin Denies fevers A&O x4... No acute distress.

## 2016-03-18 NOTE — Discharge Instructions (Signed)
Barotitis Media Barotitis media is inflammation of your middle ear. This occurs when the auditory tube (eustachian tube) leading from the back of your nose (nasopharynx) to your eardrum is blocked. This blockage may result from a cold, environmental allergies, or an upper respiratory infection. Unresolved barotitis media may lead to damage or hearing loss (barotrauma), which may become permanent. HOME CARE INSTRUCTIONS   Use medicines as recommended by your health care provider. Over-the-counter medicines will help unblock the canal and can help during times of air travel.  Do not put anything into your ears to clean or unplug them. Eardrops will not be helpful.  Do not swim, dive, or fly until your health care provider says it is all right to do so. If these activities are necessary, chewing gum with frequent, forceful swallowing may help. It is also helpful to hold your nose and gently blow to pop your ears for equalizing pressure changes. This forces air into the eustachian tube.  Only take over-the-counter or prescription medicines for pain, discomfort, or fever as directed by your health care provider.  A decongestant may be helpful in decongesting the middle ear and make pressure equalization easier. SEEK MEDICAL CARE IF:  You experience a serious form of dizziness in which you feel as if the room is spinning and you feel nauseated (vertigo).  Your symptoms only involve one ear. SEEK IMMEDIATE MEDICAL CARE IF:   You develop a severe headache, dizziness, or severe ear pain.  You have bloody or pus-like drainage from your ears.  You develop a fever.  Your problems do not improve or become worse. MAKE SURE YOU:   Understand these instructions.  Will watch your condition.  Will get help right away if you are not doing well or get worse.   This information is not intended to replace advice given to you by your health care provider. Make sure you discuss any questions you have with  your health care provider.   Document Released: 12/08/2000 Document Revised: 10/01/2013 Document Reviewed: 07/08/2013 Elsevier Interactive Patient Education 2016 [employer]  Otitis Media With Effusion Otitis media with effusion is the presence of fluid in the middle ear. This is a common problem in children, which often follows ear infections. It may be present for weeks or longer after the infection. Unlike an acute ear infection, otitis media with effusion refers only to fluid behind the ear drum and not infection. Children with repeated ear and sinus infections and allergy problems are the most likely to get otitis media with effusion. CAUSES  The most frequent cause of the fluid buildup is dysfunction of the eustachian tubes. These are the tubes that drain fluid in the ears to the back of the nose (nasopharynx). SYMPTOMS   The main symptom of this condition is hearing loss. As a result, you or your child may:  Listen to the TV at a loud volume.  Not respond to questions.  Ask "what" often when spoken to.  Mistake or confuse one sound or word for another.  There may be a sensation of fullness or pressure but usually not pain. DIAGNOSIS   Your health care provider will diagnose this condition by examining you or your child's ears.  Your health care provider may test the pressure in you or your child's ear with a tympanometer.  A hearing test may be conducted if the problem persists. TREATMENT   Treatment depends on the duration and the effects of the effusion.  Antibiotics, decongestants, nose drops, and  cortisone-type drugs (tablets or nasal spray) may not be helpful.  Children with persistent ear effusions may have delayed language or behavioral problems. Children at risk for developmental delays in hearing, learning, and speech may require referral to a specialist earlier than children not at risk.  You or your child's health care provider may suggest a referral to an  ear, nose, and throat surgeon for treatment. The following may help restore normal hearing:  Drainage of fluid.  Placement of ear tubes (tympanostomy tubes).  Removal of adenoids (adenoidectomy). HOME CARE INSTRUCTIONS   Avoid secondhand smoke.  Infants who are breastfed are less likely to have this condition.  Avoid feeding infants while they are lying flat.  Avoid known environmental allergens.  Avoid people who are sick. SEEK MEDICAL CARE IF:   Hearing is not better in 3 months.  Hearing is worse.  Ear pain.  Drainage from the ear.  Dizziness. MAKE SURE YOU:   Understand these instructions.  Will watch your condition.  Will get help right away if you are not doing well or get worse.   This information is not intended to replace advice given to you by your health care provider. Make sure you discuss any questions you have with your health care provider.   Document Released: 01/18/2005 Document Revised: 01/01/2015 Document Reviewed: 07/08/2013 Elsevier Interactive Patient Education 2016 Else[employer] Inc.  Upper Respiratory Infection, Adult For nasal and head congestion may take Sudafed PE 10 mg every 4 hours as needed. Saline nasal spray used frequently. For drainage may use Allegra, Claritin or Zyrtec. If you need stronger medicine to stop drainage may take Chlor-Trimeton 2-4 mg every 4 hours. This may cause drowsiness. Ibuprofen 600 mg every 6 hours as needed for pain, discomfort or fever. Drink plenty of fluids and stay well-hydrated.  Most upper respiratory infections (URIs) are a viral infection of the air passages leading to the lungs. A URI affects the nose, throat, and upper air passages. The most common type of URI is nasopharyngitis and is typically referred to as "the common cold." URIs run their course and usually go away on their own. Most of the time, a URI does not require medical attention, but sometimes a bacterial infection in the upper airways can  follow a viral infection. This is called a secondary infection. Sinus and middle ear infections are common types of secondary upper respiratory infections. Bacterial pneumonia can also complicate a URI. A URI can worsen asthma and chronic obstructive pulmonary disease (COPD). Sometimes, these complications can require emergency medical care and may be life threatening.  CAUSES Almost all URIs are caused by viruses. A virus is a type of germ and can spread from one person to another.  RISKS FACTORS You may be at risk for a URI if:   You smoke.   You have chronic heart or lung disease.  You have a weakened defense (immune) system.   You are very young or very old.   You have nasal allergies or asthma.  You work in crowded or poorly ventilated areas.  You work in health care facilities or schools. SIGNS AND SYMPTOMS  Symptoms typically develop 2-3 days after you come in contact with a cold virus. Most viral URIs last 7-10 days. However, viral URIs from the influenza virus (flu virus) can last 14-18 days and are typically more severe. Symptoms may include:   Runny or stuffy (congested) nose.   Sneezing.   Cough.   Sore throat.   Headache.  Fatigue.   Fever.   Loss of appetite.   Pain in your forehead, behind your eyes, and over your cheekbones (sinus pain).  Muscle aches.  DIAGNOSIS  Your health care provider may diagnose a URI by:  Physical exam.  Tests to check that your symptoms are not due to another condition such as:  Strep throat.  Sinusitis.  Pneumonia.  Asthma. TREATMENT  A URI goes away on its own with time. It cannot be cured with medicines, but medicines may be prescribed or recommended to relieve symptoms. Medicines may help:  Reduce your fever.  Reduce your cough.  Relieve nasal congestion. HOME CARE INSTRUCTIONS   Take medicines only as directed by your health care provider.   Gargle warm saltwater or take cough drops to  comfort your throat as directed by your health care provider.  Use a warm mist humidifier or inhale steam from a shower to increase air moisture. This may make it easier to breathe.  Drink enough fluid to keep your urine clear or pale yellow.   Eat soups and other clear broths and maintain good nutrition.   Rest as needed.   Return to work when your temperature has returned to normal or as your health care provider advises. You may need to stay home longer to avoid infecting others. You can also use a face mask and careful hand washing to prevent spread of the virus.  Increase the usage of your inhaler if you have asthma.   Do not use any tobacco products, including cigarettes, chewing tobacco, or electronic cigarettes. If you need help quitting, ask your health care provider. PREVENTION  The best way to protect yourself from getting a cold is to practice good hygiene.   Avoid oral or hand contact with people with cold symptoms.   Wash your hands often if contact occurs.  There is no clear evidence that vitamin C, vitamin E, echinacea, or exercise reduces the chance of developing a cold. However, it is always recommended to get plenty of rest, exercise, and practice good nutrition.  SEEK MEDICAL CARE IF:   You are getting worse rather than better.   Your symptoms are not controlled by medicine.   You have chills.  You have worsening shortness of breath.  You have brown or red mucus.  You have yellow or brown nasal discharge.  You have pain in your face, especially when you bend forward.  You have a fever.  You have swollen neck glands.  You have pain while swallowing.  You have white areas in the back of your throat. SEEK IMMEDIATE MEDICAL CARE IF:   You have severe or persistent:  Headache.  Ear pain.  Sinus pain.  Chest pain.  You have chronic lung disease and any of the following:  Wheezing.  Prolonged cough.  Coughing up blood.  A change in  your usual mucus.  You have a stiff neck.  You have changes in your:  Vision.  Hearing.  Thinking.  Mood. MAKE SURE YOU:   Understand these instructions.  Will watch your condition.  Will get help right away if you are not doing well or get worse.   This information is not intended to replace advice given to you by your health care provider. Make sure you discuss any questions you have with your health care provider.   Document Released: 06/06/2001 Document Revised: 04/27/2015 Document Reviewed: 03/18/2014 Elsevier Interactive Patient Education [employer]

## 2016-03-18 NOTE — ED Provider Notes (Signed)
CSN: [id number]     Arrival date & time 03/18/16  1700 History   First MD Initiated Contact with Patient 03/18/16 1847     Chief Complaint  Patient presents with  . URI   (Consider location/radiation/quality/duration/timing/severity/associated sxs/prior Treatment) HPI Comments: 45 year old male complaining of severe left earache for 3-4 days. Is also complaining of upper respiratory congestion and PND. He denies sore throat and fever. He states that 4 days ago he also had vomiting and diarrhea but has nearly subsided now. He has had no vomiting today and only one loose stool today. His primary complaint is that of popping in his ears and left earache.   Past Medical History  Diagnosis Date  . TIA (transient ischemic attack) 2005  . Disc degeneration    Past Surgical History  Procedure Laterality Date  . Hernia repair     Family History  Problem Relation Age of Onset  . Hypertension Mother    Social History  Substance Use Topics  . Smoking status: Current Every Day Smoker -- 1.00 packs/day for 4 years    Types: Cigarettes  . Smokeless tobacco: Never Used  . Alcohol Use: No    Review of Systems  Constitutional: Negative for fever, chills, diaphoresis, activity change and fatigue.  HENT: Positive for congestion, ear pain, postnasal drip and rhinorrhea. Negative for ear discharge, facial swelling, sore throat and trouble swallowing.   Eyes: Negative for pain, discharge and redness.  Respiratory: Negative for cough, chest tightness and shortness of breath.   Cardiovascular: Negative.   Gastrointestinal:       As per history of present illness  Musculoskeletal: Negative.  Negative for neck pain and neck stiffness.  Neurological: Negative.     Allergies  Penicillins  Home Medications   Prior to Admission medications   Medication Sig Start Date End Date Taking? Authorizing Provider  albuterol (PROVENTIL HFA;VENTOLIN HFA) 108 (90 BASE) MCG/ACT inhaler Inhale 1-2 puffs into  the lungs every 4 (four) hours as needed for wheezing or shortness of breath. 08/16/13   Marisa Severin, MD  benzonatate (TESSALON) 200 MG capsule Take 1 capsule (200 mg total) by mouth 3 (three) times daily as needed for cough. 08/16/13   Marisa Severin, MD  chlorpheniramine-HYDROcodone [facility] PENNKINETIC ER) 10-8 MG/5ML LQCR Take 5 mLs by mouth every 12 (twelve) hours as needed (for cough). 08/16/13   Marisa Severin, MD  clindamycin (CLEOCIN) 300 MG capsule Take 1 capsule (300 mg total) by mouth 3 (three) times daily. 03/18/16   Hayden Rasmussen, NP  HYDROcodone-acetaminophen (NORCO/VICODIN) 5-325 MG tablet Take 1 tablet by mouth every 4 (four) hours as needed. 03/18/16   Hayden Rasmussen, NP  traMADol (ULTRAM) 50 MG tablet Take 1 tablet (50 mg total) by mouth every 6 (six) hours as needed for pain. 07/22/13   Earley Favor, NP   Meds Ordered and Administered this Visit  Medications - No data to display  BP 145/88 mmHg  Pulse 80  Temp(Src) 98.3 F (36.8 C) (Oral)  Resp 16  SpO2 100% No data found.   Physical Exam  Constitutional: He is oriented to person, place, and time. He appears well-developed and well-nourished. No distress.  HENT:  Right TM is moderate to severely retracted. Left TM is retracted, deeply erythematous and bulging in the posterior aspect. Oropharynx with minor erythema and moderate amount of clear PND. No exudates.  Eyes: Conjunctivae and EOM are normal.  Neck: Normal range of motion. Neck supple.  Cardiovascular: Normal rate, regular rhythm and normal  heart sounds.   Pulmonary/Chest: Effort normal and breath sounds normal. No respiratory distress. He has no wheezes. He has no rales.  Musculoskeletal: Normal range of motion. He exhibits no edema.  Lymphadenopathy:    He has no cervical adenopathy.  Neurological: He is alert and oriented to person, place, and time.  Skin: Skin is warm and dry. No rash noted.  Psychiatric: He has a normal mood and affect.  Nursing note and vitals  reviewed.   ED Course  Procedures (including critical care time)  Labs Review Labs Reviewed - No data to display  Imaging Review No results found.   Visual Acuity Review  Right Eye Distance:   Left Eye Distance:   Bilateral Distance:    Right Eye Near:   Left Eye Near:    Bilateral Near:         MDM   1. Acute suppurative otitis media of left ear without spontaneous rupture of tympanic membrane, recurrence not specified   2. Barotitis media, initial encounter   3. ETD (eustachian tube dysfunction), bilateral   4. URI (upper respiratory infection)    For nasal and head congestion may take Sudafed PE 10 mg every 4 hours as needed. Saline nasal spray used frequently. For drainage may use Allegra, Claritin or Zyrtec. If you need stronger medicine to stop drainage may take Chlor-Trimeton 2-4 mg every 4 hours. This may cause drowsiness. Ibuprofen 600 mg every 6 hours as needed for pain, discomfort or fever. Drink plenty of fluids and stay well-hydrated. Meds ordered this encounter  Medications  . clindamycin (CLEOCIN) 300 MG capsule    Sig: Take 1 capsule (300 mg total) by mouth 3 (three) times daily.    Dispense:  24 capsule    Refill:  0    Order Specific Question:  Supervising Provider    Answer:  Linna HoffKINDL, JAMES D [phone][[id number]]  . HYDROcodone-acetaminophen (NORCO/VICODIN) 5-325 MG tablet    Sig: Take 1 tablet by mouth every 4 (four) hours as needed.    Dispense:  15 tablet    Refill:  0    Order Specific Question:  Supervising Provider    Answer:  Linna HoffKINDL, JAMES D [[id number]]       Hayden Rasmussenavid Garrett Bowring, NP 03/18/16 1941

## 2016-03-29 DIAGNOSIS — F17210 Nicotine dependence, cigarettes, uncomplicated: Secondary | ICD-10-CM | POA: Insufficient documentation

## 2016-03-29 DIAGNOSIS — H9202 Otalgia, left ear: Secondary | ICD-10-CM | POA: Insufficient documentation

## 2016-03-29 DIAGNOSIS — R197 Diarrhea, unspecified: Secondary | ICD-10-CM | POA: Insufficient documentation

## 2016-03-29 DIAGNOSIS — J3489 Other specified disorders of nose and nasal sinuses: Secondary | ICD-10-CM | POA: Insufficient documentation

## 2016-03-29 NOTE — ED Notes (Signed)
Patient being treated for ear infection to the left ear. Tonight patient is complaining of continued ear pain to the left ear, and pain now to the right ear and diarrhea. Patient states he has had diarrhea since starting Cleocin 03/18/16

## 2016-03-30 ENCOUNTER — Encounter (HOSPITAL_COMMUNITY): Payer: Self-pay

## 2016-03-30 ENCOUNTER — Emergency Department (HOSPITAL_COMMUNITY)
Admission: EM | Admit: 2016-03-30 | Discharge: 2016-03-30 | Disposition: A | Discharge status: Home / Self Care | Payer: Self-pay | Attending: Emergency Medicine | Admitting: Emergency Medicine

## 2016-03-30 DIAGNOSIS — H9202 Otalgia, left ear: Secondary | ICD-10-CM

## 2016-03-30 DIAGNOSIS — Q309 Congenital malformation of nose, unspecified: Secondary | ICD-10-CM

## 2016-03-30 MED ORDER — PROCHLORPERAZINE MALEATE 5 MG PO TABS
5.0000 mg | ORAL_TABLET | Freq: Four times a day (QID) | ORAL | Status: DC | PRN
  Administered 2016-03-30: 5 mg via ORAL
  Filled 2016-03-30: qty 1

## 2016-03-30 MED ORDER — DEXAMETHASONE SODIUM PHOSPHATE 4 MG/ML IJ SOLN
8.0000 mg | Freq: Once | INTRAMUSCULAR | Status: AC
  Administered 2016-03-30: 8 mg via INTRAMUSCULAR
  Filled 2016-03-30: qty 2

## 2016-03-30 MED ORDER — AZITHROMYCIN 250 MG PO TABS: ORAL_TABLET | ORAL | Status: AC

## 2016-03-30 MED ORDER — AZITHROMYCIN 250 MG PO TABS
500.0000 mg | ORAL_TABLET | Freq: Once | ORAL | Status: AC
  Administered 2016-03-30: 500 mg via ORAL
  Filled 2016-03-30: qty 2

## 2016-03-30 NOTE — Discharge Instructions (Signed)
There is swelling vs a mass of the left septum of the nose. If not improving with the antibiotic and the decongestant, please re-consider CT scanning, or see Dr Suszanne Connerseoh, ENT for evaluation. Use Zithromax daily with food. Claritin D or Allegra D may be helpful.

## 2016-03-30 NOTE — ED Provider Notes (Signed)
CSN: [id number]     Arrival date & time 03/29/16  2354 History   First MD Initiated Contact with Patient 03/30/16 0006     Chief Complaint  Patient presents with  . Otalgia     (Consider location/radiation/quality/duration/timing/severity/associated sxs/prior Treatment) Patient is a 45 y.o. male presenting with ear pain. The history is provided by the patient.  Otalgia Location:  Bilateral Behind ear:  No abnormality Quality:  Aching (pressure) Severity:  Moderate Onset quality:  Gradual Duration:  10 days Timing:  Intermittent Progression:  Unchanged Chronicity:  New Context: not direct blow and not foreign body in ear   Relieved by:  Nothing Ineffective treatments: antibiotic. Associated symptoms: congestion, cough, diarrhea and rash   Associated symptoms: no fever   Associated symptoms comment:  Rash started after starting Clindamycin.   Past Medical History  Diagnosis Date  . TIA (transient ischemic attack) 2005  . Disc degeneration    Past Surgical History  Procedure Laterality Date  . Hernia repair     Family History  Problem Relation Age of Onset  . Hypertension Mother    Social History  Substance Use Topics  . Smoking status: Current Every Day Smoker -- 1.00 packs/day for 4 years    Types: Cigarettes  . Smokeless tobacco: Never Used  . Alcohol Use: No    Review of Systems  Constitutional: Negative for fever.  HENT: Positive for congestion and ear pain.   Respiratory: Positive for cough.   Gastrointestinal: Positive for diarrhea.  Skin: Positive for rash.  All other systems reviewed and are negative.     Allergies  Penicillins  Home Medications   Prior to Admission medications   Medication Sig Start Date End Date Taking? Authorizing Provider  albuterol (PROVENTIL HFA;VENTOLIN HFA) 108 (90 BASE) MCG/ACT inhaler Inhale 1-2 puffs into the lungs every 4 (four) hours as needed for wheezing or shortness of breath. 08/16/13   Marisa Severinlga Otter, MD   benzonatate (TESSALON) 200 MG capsule Take 1 capsule (200 mg total) by mouth 3 (three) times daily as needed for cough. 08/16/13   Marisa Severinlga Otter, MD  chlorpheniramine-HYDROcodone [facility](TUSSIONEX PENNKINETIC ER) 10-8 MG/5ML LQCR Take 5 mLs by mouth every 12 (twelve) hours as needed (for cough). 08/16/13   Marisa Severinlga Otter, MD  clindamycin (CLEOCIN) 300 MG capsule Take 1 capsule (300 mg total) by mouth 3 (three) times daily. 03/18/16   Hayden Rasmussenavid Mabe, NP  HYDROcodone-acetaminophen (NORCO/VICODIN) 5-325 MG tablet Take 1 tablet by mouth every 4 (four) hours as needed. 03/18/16   Hayden Rasmussenavid Mabe, NP  traMADol (ULTRAM) 50 MG tablet Take 1 tablet (50 mg total) by mouth every 6 (six) hours as needed for pain. 07/22/13   Earley FavorGail Schulz, NP   BP 141/81 mmHg  Pulse 78  Temp(Src) 97.7 F (36.5 C) (Oral)  Ht 5\' 9"  (1.753 m)  Wt 89.359 kg  BMI 29.08 kg/m2  SpO2 99% Physical Exam  Constitutional: He is oriented to person, place, and time. He appears well-developed and well-nourished.  Non-toxic appearance.  HENT:  Head: Normocephalic.  Right Ear: Tympanic membrane and external ear normal.  Left Ear: Tympanic membrane and external ear normal.  There is swelling vs mass of the left septum of the nose. Difficulty breathing through the left nostril. Mild discomfort to percussion of the left max sinus.  Minimal enlargement of the uvula. Airway patent.  No bulging or redness of the TM's. EAC have a small amount of cerumen, no fb no swelling.  Eyes: EOM and lids are normal.  Pupils are equal, round, and reactive to light.  Neck: Normal range of motion. Neck supple. Carotid bruit is not present.  Cardiovascular: Normal rate, regular rhythm, normal heart sounds, intact distal pulses and normal pulses.   Pulmonary/Chest: Breath sounds normal. No respiratory distress.  Abdominal: Soft. Bowel sounds are normal. There is no tenderness. There is no guarding.  Musculoskeletal: Normal range of motion.  Lymphadenopathy:       Head (right side):  No submandibular adenopathy present.       Head (left side): No submandibular adenopathy present.    He has no cervical adenopathy.  Neurological: He is alert and oriented to person, place, and time. He has normal strength. No cranial nerve deficit or sensory deficit. He exhibits normal muscle tone. Coordination normal.  Skin: Skin is warm and dry.  Psychiatric: He has a normal mood and affect. His speech is normal.  Nursing note and vitals reviewed.   ED Course  Procedures (including critical care time) Labs Review Labs Reviewed - No data to display  Imaging Review No results found. I have personally reviewed and evaluated these images and lab results as part of my medical decision-making.   EKG Interpretation None      MDM Vital signs stable.  CT scan of the Max/facial area offered. Pt is concerned about the price since he does not have insurance. He request to try the medications for now. He will see ENT if not improving. Pt to stop clindamycin due to diarrhea. Rx for zithromax given. Pt will return to ED if he changes his mind about the CT, or has new problems.   Final diagnoses:  Otalgia of left ear  Abnormal nasal septum    *I have reviewed nursing notes, vital signs, and all appropriate lab and imaging results for this patient.Ivery Quale, PA-C 03/30/16 0131  Devoria Albe, MD 03/30/16 [phone]

## 2017-02-15 ENCOUNTER — Emergency Department (HOSPITAL_COMMUNITY)
Admission: EM | Admit: 2017-02-15 | Discharge: 2017-02-15 | Disposition: A | Discharge status: Home / Self Care | Payer: Self-pay | Attending: Emergency Medicine | Admitting: Emergency Medicine

## 2017-02-15 ENCOUNTER — Encounter (HOSPITAL_COMMUNITY): Payer: Self-pay

## 2017-02-15 DIAGNOSIS — F17210 Nicotine dependence, cigarettes, uncomplicated: Secondary | ICD-10-CM | POA: Insufficient documentation

## 2017-02-15 DIAGNOSIS — L239 Allergic contact dermatitis, unspecified cause: Secondary | ICD-10-CM

## 2017-02-15 DIAGNOSIS — K625 Hemorrhage of anus and rectum: Secondary | ICD-10-CM

## 2017-02-15 DIAGNOSIS — R319 Hematuria, unspecified: Secondary | ICD-10-CM

## 2017-02-15 DIAGNOSIS — Z8673 Personal history of transient ischemic attack (TIA), and cerebral infarction without residual deficits: Secondary | ICD-10-CM | POA: Insufficient documentation

## 2017-02-15 LAB — URINALYSIS, ROUTINE W REFLEX MICROSCOPIC
Bacteria, UA: NONE SEEN
Bilirubin Urine: NEGATIVE
GLUCOSE, UA: NEGATIVE mg/dL
Ketones, ur: NEGATIVE mg/dL
Leukocytes, UA: NEGATIVE
Nitrite: NEGATIVE
Protein, ur: NEGATIVE mg/dL
SPECIFIC GRAVITY, URINE: 1.008 (ref 1.005–1.030)
SQUAMOUS EPITHELIAL / LPF: NONE SEEN
WBC, UA: NONE SEEN WBC/hpf (ref 0–5)
pH: 7 (ref 5.0–8.0)

## 2017-02-15 LAB — COMPREHENSIVE METABOLIC PANEL
ALK PHOS: 66 U/L (ref 38–126)
ALT: 22 U/L (ref 17–63)
AST: 23 U/L (ref 15–41)
Albumin: 3.7 g/dL (ref 3.5–5.0)
Anion gap: 6 (ref 5–15)
BILIRUBIN TOTAL: 0.6 mg/dL (ref 0.3–1.2)
BUN: 10 mg/dL (ref 6–20)
CALCIUM: 9 mg/dL (ref 8.9–10.3)
CO2: 25 mmol/L (ref 22–32)
Chloride: 107 mmol/L (ref 101–111)
Creatinine, Ser: 0.83 mg/dL (ref 0.61–1.24)
GFR calc non Af Amer: >60 mL/min (ref >60)
GLUCOSE: 87 mg/dL (ref 65–99)
Potassium: 4.1 mmol/L (ref 3.5–5.1)
SODIUM: 138 mmol/L (ref 135–145)
TOTAL PROTEIN: 6.5 g/dL (ref 6.5–8.1)

## 2017-02-15 LAB — CBC
HCT: 42.7 % (ref 39.0–52.0)
Hemoglobin: 14.8 g/dL (ref 13.0–17.0)
MCH: 32.9 pg (ref 26.0–34.0)
MCHC: 34.7 g/dL (ref 30.0–36.0)
MCV: 94.9 fL (ref 78.0–100.0)
Platelets: 200 10*3/uL (ref 150–400)
RBC: 4.5 MIL/uL (ref 4.22–5.81)
RDW: 12.7 % (ref 11.5–15.5)
WBC: 8.8 10*3/uL (ref 4.0–10.5)

## 2017-02-15 LAB — POC OCCULT BLOOD, ED: FECAL OCCULT BLD: NEGATIVE

## 2017-02-15 MED ORDER — HYDROCORTISONE ACETATE 25 MG RE SUPP: 25.0000 mg | Freq: Two times a day (BID) | RECTAL | 0 refills | Status: AC

## 2017-02-15 MED ORDER — LORATADINE 10 MG PO TABS: 10.0000 mg | ORAL_TABLET | Freq: Every day | ORAL | 0 refills | Status: AC

## 2017-02-15 NOTE — ED Notes (Signed)
ED Provider at bedside. 

## 2017-02-15 NOTE — ED Triage Notes (Signed)
Patient reports that he noticed blood in his urine and stool yesterday, no pain on arrival. Reports some bladder pressure before he voids. NAD, alert and oriented, skin warm and dry

## 2017-02-15 NOTE — ED Provider Notes (Signed)
[facility]-EMERGENCY DEPT Provider Note   CSN: [id number] Arrival date & time: 02/15/17  1405   By signing my name below, I, Joe Mendez, attest that this documentation has been prepared under the direction and in the presence of Joe LefevreJulie Kalii Chesmore, MD . Electronically Signed: Freida Busmaniana Mendez, Scribe. 02/15/2017. 4:26 PM.   History   Chief Complaint Chief Complaint  Patient presents with  . Blood In Stools    The history is provided by the patient. No language interpreter was used.    HPI Comments:  Joe Mendez is a 46 y.o. male who presents to the Emergency Department complaining of 1 episode of blood in his stool and urine yesterday. He states he has had a BM without blood since symptom onset and states his BM have been otherwise normal as far as effort and consistency.  He denies abdominal and rectal pain. Pt also denies current hemorrhoids. No alleviating factors noted.   Pt is also complaining of a burning pruritic rash to his BLE x a few weeks. He has applied cortisone with moderate relief. He denies use of new soaps/lotions/detergents.    Past Medical History:  Diagnosis Date  . Disc degeneration   . TIA (transient ischemic attack) 2005    There are no active problems to display for this patient.   Past Surgical History:  Procedure Laterality Date  . HERNIA REPAIR         Home Medications    Prior to Admission medications   Medication Sig Start Date End Date Taking? Authorizing Provider  albuterol (PROVENTIL HFA;VENTOLIN HFA) 108 (90 BASE) MCG/ACT inhaler Inhale 1-2 puffs into the lungs every 4 (four) hours as needed for wheezing or shortness of breath. 08/16/13   Joe Severinlga Otter, MD  azithromycin (ZITHROMAX) 250 MG tablet 1 po daily with food 03/30/16   Joe QualeHobson Bryant, PA-C  benzonatate (TESSALON) 200 MG capsule Take 1 capsule (200 mg total) by mouth 3 (three) times daily as needed for cough. 08/16/13   Joe Severinlga Otter, MD  chlorpheniramine-HYDROcodone [facility](TUSSIONEX PENNKINETIC ER)  10-8 MG/5ML LQCR Take 5 mLs by mouth every 12 (twelve) hours as needed (for cough). 08/16/13   Joe Severinlga Otter, MD  clindamycin (CLEOCIN) 300 MG capsule Take 1 capsule (300 mg total) by mouth 3 (three) times daily. 03/18/16   Joe Rasmussenavid Mabe, NP  HYDROcodone-acetaminophen (NORCO/VICODIN) 5-325 MG tablet Take 1 tablet by mouth every 4 (four) hours as needed. 03/18/16   Joe Rasmussenavid Mabe, NP  hydrocortisone (ANUSOL-HC) 25 MG suppository Place 1 suppository (25 mg total) rectally 2 (two) times daily. 02/15/17   Joe LefevreJulie Oakley Kossman, MD  loratadine (CLARITIN) 10 MG tablet Take 1 tablet (10 mg total) by mouth daily. 02/15/17   Joe LefevreJulie Makyia Erxleben, MD  traMADol (ULTRAM) 50 MG tablet Take 1 tablet (50 mg total) by mouth every 6 (six) hours as needed for pain. 07/22/13   Earley FavorGail Schulz, NP    Family History Family History  Problem Relation Age of Onset  . Hypertension Mother     Social History Social History  Substance Use Topics  . Smoking status: Current Every Day Smoker    Packs/day: 1.00    Years: 4.00    Types: Cigarettes  . Smokeless tobacco: Never Used  . Alcohol use No     Allergies   Penicillins   Review of Systems Review of Systems  Constitutional: Negative for fever.  Gastrointestinal: Positive for blood in stool. Negative for abdominal pain, diarrhea and rectal pain.  Genitourinary: Positive for hematuria.  Skin: Positive for rash.  All other systems reviewed and are negative.    Physical Exam Updated Vital Signs BP 147/80   Pulse 68   Temp 97.9 F (36.6 C) (Oral)   Resp 16   SpO2 100%   Physical Exam  Constitutional: He is oriented to person, place, and time. He appears well-developed and well-nourished. No distress.  HENT:  Head: Normocephalic and atraumatic.  Eyes: Conjunctivae are normal.  Cardiovascular: Normal rate.   Pulmonary/Chest: Effort normal.  Abdominal: He exhibits no distension.  Genitourinary: Rectal exam shows external hemorrhoid.  Genitourinary Comments:   Chaperone  was  present for exam which was performed with no discomfort or complications.   Neurological: He is alert and oriented to person, place, and time.  Skin: Skin is warm and dry. Rash noted.  Red itchy rash to bilateral calves   Psychiatric: He has a normal mood and affect.  Nursing note and vitals reviewed.    ED Treatments / Results  DIAGNOSTIC STUDIES:  Oxygen Saturation is 100% on RA, normal by my interpretation.    COORDINATION OF CARE:  4:23 PM Discussed treatment plan with pt at bedside and pt agreed to plan.  Labs (all labs ordered are listed, but only abnormal results are displayed) Labs Reviewed  URINALYSIS, ROUTINE W REFLEX MICROSCOPIC - Abnormal; Notable for the following:       Result Value   Color, Urine STRAW (*)    Hgb urine dipstick MODERATE (*)    All other components within normal limits  COMPREHENSIVE METABOLIC PANEL  CBC  POC OCCULT BLOOD, ED    EKG  EKG Interpretation None       Radiology No results found.  Procedures Procedures (including critical care time)  Medications Ordered in ED Medications - No data to display   Initial Impression / Assessment and Plan / ED Course  I have reviewed the triage vital signs and the nursing notes.  Pertinent labs & imaging results that were available during my care of the patient were reviewed by me and considered in my medical decision making (see chart for details).    H/h and plt nl.  Pt stable for d/c.  He is told that he needs to establish pcp, f/u with urology and gi.  He knows to return if worse.  Final Clinical Impressions(s) / ED Diagnoses   Final diagnoses:  Hematuria, unspecified type  Rectal bleeding  Allergic contact dermatitis, unspecified trigger    New Prescriptions New Prescriptions   HYDROCORTISONE (ANUSOL-HC) 25 MG SUPPOSITORY    Place 1 suppository (25 mg total) rectally 2 (two) times daily.   LORATADINE (CLARITIN) 10 MG TABLET    Take 1 tablet (10 mg total) by mouth daily.   I  personally performed the services described in this documentation, which was scribed in my presence. The recorded information has been reviewed and is accurate.     Joe Lefevre, MD 02/15/17 [phone]

## 2024-02-27 ENCOUNTER — Ambulatory Visit (INDEPENDENT_AMBULATORY_CARE_PROVIDER_SITE_OTHER): Admitting: Family

## 2024-02-27 DIAGNOSIS — L600 Ingrowing nail: Secondary | ICD-10-CM | POA: Diagnosis not present

## 2024-02-27 MED ORDER — TERBINAFINE HCL 250 MG PO TABS: 250.0000 mg | ORAL_TABLET | Freq: Every day | ORAL | 0 refills | Status: AC

## 2024-02-27 MED ORDER — HYDROCODONE-ACETAMINOPHEN 5-325 MG PO TABS: 1.0000 | ORAL_TABLET | Freq: Four times a day (QID) | ORAL | 0 refills | Status: AC | PRN

## 2024-02-27 NOTE — Progress Notes (Signed)
 Office Visit Note   Patient: Joe Mendez           Date of Birth: Apr 10, 1971           MRN: [id number] Visit Date: 02/27/2024              Requested by: No referring provider defined for this encounter. PCP: Benita Stabile, MD  Chief Complaint  Patient presents with   Right Foot - Pain   Left Foot - Pain      HPI: The patient is a 53 year old gentleman seen today for bilateral great toenail discoloration and pain.  Reports they have become more thick over the last 6 to 8 months.  He has had a history of difficulty with ingrown nails in the past but nothing as bad as what he is experiencing today.  Has been having significant pain tenderness and ingrown curling of the left great toenail medially  Assessment & Plan: Visit Diagnoses: No diagnosis found.  Plan: Excision of the medial nail plate.  Patient tolerated well.  He will follow-up in the office in 4 weeks for redraw of his liver function panel  Follow-Up Instructions: Return in about 4 weeks (around 03/26/2024).   Ortho Exam  Patient is alert, oriented, no adenopathy, well-dressed, normal affect, normal respiratory effort. On examination bilateral feet he does have thickened discolored onychomycotic nails x 4 the great toenails are affected the left great toenail is ingrown along the medial border this is quite tender for him he does not tolerate nail trim due to pain  There is no erythema warmth no palpable area of fluctuance no infection  Imaging: No results found. No images are attached to the encounter.  Labs: Lab Results  Component Value Date   REPTSTATUS 07/13/2010 FINAL 07/11/2010   CULT NO GROWTH 07/11/2010     Lab Results  Component Value Date   ALBUMIN 3.7 02/15/2017   ALBUMIN 3.6 03/23/2009   ALBUMIN 3.8 02/04/2009    No results found for: "MG" No results found for: "VD25OH"  No results found for: "PREALBUMIN"    Latest Ref Rng & Units 02/15/2017    2:18 PM 08/15/2013   10:17 PM 07/22/2013    12:01 AM  CBC EXTENDED  WBC 4.0 - 10.5 K/uL 8.8  9.2    RBC 4.22 - 5.81 MIL/uL 4.50  4.28    Hemoglobin 13.0 - 17.0 g/dL 95.2  84.1  32.4   HCT 39.0 - 52.0 % 42.7  39.8  42.0   Platelets 150 - 400 K/uL 200  232       There is no height or weight on file to calculate BMI.  Orders:  No orders of the defined types were placed in this encounter.  No orders of the defined types were placed in this encounter.    Procedures: Nail Removal  Date/Time: 02/27/2024 4:40 PM  Performed by: Adonis Huguenin, NP Authorized by: Adonis Huguenin, NP   Consent:    Consent obtained:  Verbal   Consent given by:  Patient   Risks discussed:  Bleeding, incomplete removal, infection, pain and permanent nail deformity   Alternatives discussed:  No treatment, delayed treatment, alternative treatment and observation Universal protocol:    Procedure explained and questions answered to patient or proxy's satisfaction: yes     Relevant documents present and verified: yes     Test results available: yes     Imaging studies available: yes     Required blood products,  implants, devices, and special equipment available: yes     Site/side marked: yes     Immediately prior to procedure a time out was called: yes     Patient identity confirmed:  Verbally with patient Location:    Foot:  L big toe Pre-procedure details:    Skin preparation:  Betadine   Preparation: Patient was prepped and draped in the usual sterile fashion   Anesthesia:    Anesthesia method:  Local infiltration   Local anesthetic:  Lidocaine 1% w/o epi Nail Removal:    Nail removed:  Partial   Nail side:  Medial Ingrown nail:    Nail matrix removed or ablated:  Partial Nails trimmed:    Number of nails trimmed:  1 Post-procedure details:    Dressing:  4x4 sterile gauze and Xeroform gauze   Procedure completion:  Tolerated well, no immediate complications   Clinical Data: No additional findings.  ROS:  All other systems  negative, except as noted in the HPI. Review of Systems  Objective: Vital Signs: There were no vitals taken for this visit.  Specialty Comments:  No specialty comments available.  PMFS History: There are no active problems to display for this patient.  Past Medical History:  Diagnosis Date   Disc degeneration    TIA (transient ischemic attack) 2005    Family History  Problem Relation Age of Onset   Hypertension Mother     Past Surgical History:  Procedure Laterality Date   HERNIA REPAIR     Social History   Occupational History   Not on file  Tobacco Use   Smoking status: Every Day    Current packs/day: 1.00    Average packs/day: 1 pack/day for 4.0 years (4.0 ttl pk-yrs)    Types: Cigarettes   Smokeless tobacco: Never  Substance and Sexual Activity   Alcohol use: No   Drug use: No   Sexual activity: Not on file

## 2024-02-28 ENCOUNTER — Ambulatory Visit

## 2024-02-28 ENCOUNTER — Telehealth: Payer: Self-pay | Admitting: Family

## 2024-02-28 NOTE — Telephone Encounter (Signed)
 Pt called requesting a boot for foot. Pt states he can barely bare weight. Please call pt about this matter at [phone].

## 2024-02-28 NOTE — Progress Notes (Signed)
 Patient came in to the office today to pick up a post op shoe. He had a toenail removed in office yesterday and wearing regular shoes is painful. I fitted him for a size XL patient stated that it felt good. He signed the DME tablet.

## 2024-02-28 NOTE — Telephone Encounter (Signed)
 SW pt, he is coming in this afternoon to be fitted for a post op shoe.

## 2024-03-26 ENCOUNTER — Ambulatory Visit: Admitting: Family

## 2024-03-26 DIAGNOSIS — B351 Tinea unguium: Secondary | ICD-10-CM | POA: Diagnosis not present

## 2024-03-27 LAB — HEPATIC FUNCTION PANEL
AG Ratio: 1.7 (calc) (ref 1.0–2.5)
ALT: 18 U/L (ref 9–46)
AST: 19 U/L (ref 10–35)
Albumin: 4.1 g/dL (ref 3.6–5.1)
Alkaline phosphatase (APISO): 60 U/L (ref 35–144)
Bilirubin, Direct: 0.1 mg/dL (ref 0.0–0.2)
Globulin: 2.4 g/dL (ref 1.9–3.7)
Indirect Bilirubin: 0.4 mg/dL (ref 0.2–1.2)
Total Bilirubin: 0.5 mg/dL (ref 0.2–1.2)
Total Protein: 6.5 g/dL (ref 6.1–8.1)

## 2024-04-01 ENCOUNTER — Encounter: Payer: Self-pay | Admitting: Family

## 2024-04-01 NOTE — Progress Notes (Signed)
 Office Visit Note   Patient: Joe Mendez           Date of Birth: [birth year]           MRN: [id number] Visit Date: 03/26/2024              Requested by: Benita Stabile, MD [address] [place],  [place] [address] PCP: Benita Stabile, MD  Chief Complaint  Patient presents with   Left Foot - Follow-up   Right Foot - Follow-up      HPI: The patient is a 53 year old gentleman who is seen in follow-up for onychomycosis.  He was last seen March 5 at that time did have excision of the medial nail plate of the great toe.  He also began taking Lamisil.  He denies any concerns has no pain issues no drainage from the great toe  Assessment & Plan: Visit Diagnoses:  1. Onychomycosis     Plan: Will redraw liver function today.  He will continue on Lamisil.  Follow-up in the office as needed.  Follow-Up Instructions: No follow-ups on file.   Ortho Exam  Patient is alert, oriented, no adenopathy, well-dressed, normal affect, normal respiratory effort. On examination left foot the great toe shows absent nail plate.  There is no drainage no open area no ulcer.  Thickened and discolored onychomycotic nails x 4 left foot.  Imaging: No results found. No images are attached to the encounter.  Labs: Lab Results  Component Value Date   REPTSTATUS 07/13/2010 FINAL 07/11/2010   CULT NO GROWTH 07/11/2010     Lab Results  Component Value Date   ALBUMIN 3.7 02/15/2017   ALBUMIN 3.6 03/23/2009   ALBUMIN 3.8 02/04/2009    No results found for: "MG" No results found for: "VD25OH"  No results found for: "PREALBUMIN"    Latest Ref Rng & Units 02/15/2017    2:18 PM 08/15/2013   10:17 PM 07/22/2013   12:01 AM  CBC EXTENDED  WBC 4.0 - 10.5 K/uL 8.8  9.2    RBC 4.22 - 5.81 MIL/uL 4.50  4.28    Hemoglobin 13.0 - 17.0 g/dL 19.1  47.8  29.5   HCT 39.0 - 52.0 % 42.7  39.8  42.0   Platelets 150 - 400 K/uL 200  232       There is no height or weight on file to calculate  BMI.  Orders:  Orders Placed This Encounter  Procedures   Hepatic function panel   No orders of the defined types were placed in this encounter.    Procedures: No procedures performed  Clinical Data: No additional findings.  ROS:  All other systems negative, except as noted in the HPI. Review of Systems  Objective: Vital Signs: There were no vitals taken for this visit.  Specialty Comments:  No specialty comments available.  PMFS History: There are no active problems to display for this patient.  Past Medical History:  Diagnosis Date   Disc degeneration    TIA (transient ischemic attack) 2005    Family History  Problem Relation Age of Onset   Hypertension Mother     Past Surgical History:  Procedure Laterality Date   HERNIA REPAIR     Social History   Occupational History   Not on file  Tobacco Use   Smoking status: Every Day    Current packs/day: 1.00    Average packs/day: 1 pack/day for 4.0 years (4.0 ttl pk-yrs)  Types: Cigarettes   Smokeless tobacco: Never  Substance and Sexual Activity   Alcohol use: No   Drug use: No   Sexual activity: Not on file
